# Patient Record
Sex: Female | Born: 1995 | Race: White | Hispanic: No | Marital: Single | Smoking: Never smoker
Health system: Southern US, Community
[De-identification: ages and names within clinical notes are randomized; demographics above are authoritative.]

## PROBLEM LIST (undated history)

## (undated) ENCOUNTER — Ambulatory Visit

## (undated) DIAGNOSIS — F800 Phonological disorder: Secondary | ICD-10-CM

## (undated) DIAGNOSIS — R010 Benign and innocent cardiac murmurs: Secondary | ICD-10-CM

## (undated) DIAGNOSIS — K900 Celiac disease: Secondary | ICD-10-CM

## (undated) DIAGNOSIS — D649 Anemia, unspecified: Secondary | ICD-10-CM

## (undated) DIAGNOSIS — N946 Dysmenorrhea, unspecified: Secondary | ICD-10-CM

## (undated) DIAGNOSIS — F8089 Other developmental disorders of speech and language: Secondary | ICD-10-CM

## (undated) HISTORY — DX: Anemia, unspecified: D64.9

## (undated) HISTORY — DX: Celiac disease: K90.0

## (undated) HISTORY — PX: SEPTOPLASTY: SUR1290

## (undated) HISTORY — DX: Phonological disorder: F80.0

## (undated) HISTORY — DX: Other developmental disorders of speech and language: F80.89

## (undated) HISTORY — DX: Dysmenorrhea, unspecified: N94.6

## (undated) HISTORY — PX: OTHER SURGICAL HISTORY: SHX169

## (undated) HISTORY — DX: Benign and innocent cardiac murmurs: R01.0

---
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
## 2016-03-15 NOTE — Progress Notes (Signed)
Chief Complaint  Patient presents with  . Contraception  . Annual Exam    HPI: Patient  Teresa [name]  21 y.o. comes in today for Preventive Health Care visit  Periods 5-6 days   And  crampsst coouple days Photo and im sports .  Softball  Volleyball  No sti risk at this time Has been anemic when troed to donate blood ? 10 at periods  No bleeding otherwise  No hx clotting  tobacco  Health Maintenance  Topic Date Due  . HIV Screening  03/17/2017 (Originally 11/22/2011)  . TETANUS/TDAP  08/21/2018  . INFLUENZA VACCINE  Completed   Health Maintenance Review LIFESTYLE:  Exercise:  y Tobacco/ETS:n Alcohol: rare Sugar beverages: week  Sleep:  Around 7-8  Drug use: [facility]   Business .  15 hours .  Live on campus.   .      Work:school  Photo [paper  Looking into y counselor for summer    ROS:  GEN/ HEENT: No fever, significant weight changes sweats headaches vision problems hearing changes, CV/ PULM; No chest pain shortness of breath cough, syncope,edema  change in exercise tolerance. GI /GU: No adominal pain, vomiting, change in bowel habits. No blood in the stool. No significant GU symptoms. SKIN/HEME: ,no acute skin rashes suspicious lesions or bleeding. No lymphadenopathy, nodules, masses.  NEURO/ PSYCH:  No neurologic signs such as weakness numbness. No depression anxiety. IMM/ Allergy: No unusual infections.  Allergy .   REST of 12 system review negative except as per HPI   Past Medical History:  Diagnosis Date  . Anemia   . 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 ARTICULATION DISORDER 08/03/2009   Qualifier: History of  By: Fabian SharpPanosh MD, Neta MendsWanda K   . 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 No pertinent surgical history.  Family History  Problem Relation Age of Onset  . ADD / ADHD Mother   . Diabetes Mother     type 2   . ADD / ADHD Sister   . Hyperlipidemia    . Depression    . Hypertension      Social History   Social  History  . Marital status: Single    Spouse name: N/A  . Number of children: N/A  . Years of education: N/A   Social History Main Topics  . Smoking status: Never Smoker  . Smokeless tobacco: Never Used  . Alcohol use 0.0 oz/week     Comment: rarely   . Drug use: No  . Sexual activity: No   Other Topics Concern  . None   Social History Narrative   [employer].    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   No facility-administered medications prior to visit.      EXAM:  BP 100/70 (BP Location: Right Arm, Patient Position: Sitting, Cuff Size: Normal)   Pulse 85   Temp 98.4 F (36.9 C) (Oral)   Ht 5' 8.5" (1.74 m)   Wt 152 lb 4.8 oz (69.1 kg)  LMP 02/25/2016   BMI 22.82 kg/m   Body mass index is 22.82 kg/m. Wt Readings from Last 3 Encounters:  03/17/16 152 lb 4.8 oz (69.1 kg) (83 %, Z= 0.95)*  07/03/15 157 lb 1.6 oz (71.3 kg) (88 %, Z= 1.15)*  08/23/13 155 lb (70.3 kg) (89 %, Z= 1.23)*   * Growth percentiles are based on CDC 2-20 Years data.    Physical Exam: Vital signs reviewed ZOX:WRUE is a well-developed well-nourished alert cooperative    who appearsr stated age in no acute distress.  HEENT: normocephalic atraumatic , Eyes: PERRL EOM's full, conjunctiva clear, Nares: paten,t no deformity discharge or tenderness., Ears: no deformity EAC's clear TMs with normal landmarks. Mouth: clear OP, no lesions, edema.  Moist mucous membranes. Dentition in adequate repair. NECK: supple without masses, thyromegaly or bruits. CHEST/PULM:  Clear to auscultation and percussion breath sounds equal no wheeze , rales or rhonchi. No chest wall deformities or tenderness. Breast: normal by inspection . No  dimpling, discharge, masses, tenderness or discharge . CV: PMI is nondisplaced, S1 S2 no gallops, murmurs, rubs. Peripheral pulses are full without delay.No JVD .  ABDOMEN: Bowel sounds normal nontender  No guard or rebound, no hepato splenomegal no CVA tenderness.  No hernia. Extremtities:  No clubbing cyanosis or edema, no acute joint swelling or redness no focal atrophy NEURO:  Oriented x3, cranial nerves 3-12 appear to be intact, no obvious focal weakness,gait within normal limits no abnormal reflexes or asymmetrical SKIN: No acute rashes normal turgor, color, no bruising or petechiae. PSYCH: Oriented, good eye contact, no obvious depression anxiety, cognition and judgment appear normal. LN: no cervical axillary inguinal adenopathy  Lab Results  Component Value Date   WBC 7.0 07/03/2015   HGB 11.1 (L) 07/03/2015   HCT 34.4 07/03/2015   PLT 278 07/03/2015   GLUCOSE 71 07/03/2015   CHOL 123 (L) 07/03/2015   TRIG 85 07/03/2015   HDL 50 07/03/2015   LDLCALC 56 07/03/2015   ALT 9 07/03/2015   AST 15 07/03/2015   NA 141 07/03/2015   K 4.2 07/03/2015   CL 105 07/03/2015   CREATININE 0.88 07/03/2015   BUN 12 07/03/2015   CO2 22 07/03/2015   TSH 2.50 07/03/2015    BP Readings from Last 3 Encounters:  03/17/16 100/70  07/03/15 124/62  08/23/13 106/64   Wt Readings from Last 3 Encounters:  03/17/16 152 lb 4.8 oz (69.1 kg) (83 %, Z= 0.95)*  07/03/15 157 lb 1.6 oz (71.3 kg) (88 %, Z= 1.15)*  08/23/13 155 lb (70.3 kg) (89 %, Z= 1.23)*   * Growth percentiles are based on CDC 2-20 Years data.     immuniz   ASSESSMENT AND PLAN:  Discussed the following assessment and plan:  Visit for preventive health examination  OCP (oral contraceptive pills) initiation  Anemia, mild Hx mild anemias prob iron defi  ocps may help this   And Loestrin with Iron   Plan rov in 3 mos  3 packs and evaluate anemia status etc   Risk benefit of medication discussed.  Patient Care Team: Madelin Headings, MD as PCP - General Patient Instructions  \Continue lifestyle intervention healthy eating and exercise . Healthy lifestyle includes : At least 150 minutes of exercise weeks  , weight at healthy levels, which is usually   BMI 19-25. Avoid trans fats and processed foods;  Increase fresh fruits and veges to 5 servings per day. And avoid sweet beverages including tea and juice.  Mediterranean diet with olive oil and nuts have been noted to be heart and brain healthy . Avoid tobacco products . Limit  alcohol to  7 per week for women at most   and 14 servings for men.  Get adequate sleep . Wear seat belts . Don't text and drive .   Can begin ocps    If after 3 months any sx or irreg bleeding concerns  Then  Make ov to discuss.     Oral Contraception Use Oral contraceptive pills (OCPs) are medicines taken to prevent pregnancy. OCPs work by preventing the ovaries from releasing eggs. The hormones in OCPs also cause the cervical mucus to thicken, preventing the sperm from entering the uterus. The hormones also cause the uterine lining to become thin, not allowing a fertilized egg to attach to the inside of the uterus. OCPs are highly effective when taken exactly as prescribed. However, OCPs do not prevent sexually transmitted diseases (STDs). Safe sex practices, such as using condoms along with an OCP, can help prevent STDs. Before taking OCPs, you may have a physical exam and Pap test. Your health care provider may also order blood tests if necessary. Your health care provider will make sure you are a good candidate for oral contraception. Discuss with your health care provider the possible side effects of the OCP you may be prescribed. When starting an OCP, it can take 2 to 3 months for the body to adjust to the changes in hormone levels in your body. How to take oral contraceptive pills Your health care provider may advise you on how to start taking the first cycle of OCPs. Otherwise, you  can:  Start on day 1 of your menstrual period. You will not need any backup contraceptive protection with this start time.  Start on the first Sunday after your menstrual period or the day you get your prescription. In these cases, you will need to use backup contraceptive protection for the first week.  Start the pill at any time of your cycle. If you take the pill within 5 days of the start of your period, you are protected against pregnancy right away. In this case, you will not need a backup form of birth control. If you start at any other time of your menstrual cycle, you will need to use another form of birth control for 7 days. If your OCP is the type called a minipill, it will protect you from pregnancy after taking it for 2 days (48 hours). After you have started taking OCPs:  If you forget to take 1 pill, take it as soon as you remember. Take the next pill at the regular time.  If you miss 2 or more pills, call your health care provider because different pills have different instructions for missed doses. Use backup birth control until your next menstrual period starts.  If you use a 28-day pack that contains inactive pills and you miss 1 of the last 7 pills (pills with no hormones), it will not matter. Throw away the rest of the non-hormone pills and start a new pill pack. No matter which day you start the OCP, you will always start a new pack on that same day of the week. Have an extra pack of OCPs and a backup contraceptive method available in case you miss some pills or lose your OCP pack. Follow these instructions at home:  Do not smoke.  Always use a condom to protect against STDs. OCPs do not protect  against STDs.  Use a calendar to mark your menstrual period days.  Read the information and directions that came with your OCP. Talk to your health care provider if you have questions. Contact a health care provider if:  You develop nausea and vomiting.  You have abnormal  vaginal discharge or bleeding.  You develop a rash.  You miss your menstrual period.  You are losing your hair.  You need treatment for mood swings or depression.  You get dizzy when taking the OCP.  You develop acne from taking the OCP.  You become pregnant. Get help right away if:  You develop chest pain.  You develop shortness of breath.  You have an uncontrolled or severe headache.  You develop numbness or slurred speech.  You develop visual problems.  You develop pain, redness, and swelling in the legs. This information is not intended to replace advice given to you by your health care provider. Make sure you discuss any questions you have with your health care provider. Document Released: 12/09/2010 Document Revised: 05/28/2015 Document Reviewed: 06/10/2012 Elsevier Interactive Patient Education  2017 [employer].       [place]. Avary Eichenberger M.D.

## 2016-03-17 ENCOUNTER — Encounter: Payer: Self-pay | Admitting: Internal Medicine

## 2016-03-17 ENCOUNTER — Ambulatory Visit (INDEPENDENT_AMBULATORY_CARE_PROVIDER_SITE_OTHER): Payer: BLUE CROSS/BLUE SHIELD | Admitting: Internal Medicine

## 2016-03-17 VITALS — BP 100/70 | HR 85 | Temp 98.4°F | Ht 68.5 in | Wt 152.3 lb

## 2016-03-17 DIAGNOSIS — Z0000 Encounter for general adult medical examination without abnormal findings: Secondary | ICD-10-CM | POA: Diagnosis not present

## 2016-03-17 DIAGNOSIS — D649 Anemia, unspecified: Secondary | ICD-10-CM

## 2016-03-17 DIAGNOSIS — Z30011 Encounter for initial prescription of contraceptive pills: Secondary | ICD-10-CM

## 2016-03-17 LAB — CBC WITH DIFFERENTIAL/PLATELET
Basophils Absolute: 0.1 10*3/uL (ref 0.0–0.1)
Basophils Relative: 1.7 % (ref 0.0–3.0)
EOS PCT: 1.5 % (ref 0.0–5.0)
Eosinophils Absolute: 0.1 10*3/uL (ref 0.0–0.7)
HCT: 38.6 % (ref 36.0–49.0)
Hemoglobin: 12.5 g/dL (ref 12.0–16.0)
LYMPHS ABS: 1.7 10*3/uL (ref 0.7–4.0)
Lymphocytes Relative: 37 % (ref 24.0–48.0)
MCHC: 32.3 g/dL (ref 31.0–37.0)
MCV: 83.8 fl (ref 78.0–98.0)
MONO ABS: 0.5 10*3/uL (ref 0.1–1.0)
Monocytes Relative: 11.2 % (ref 3.0–12.0)
NEUTROS ABS: 2.2 10*3/uL (ref 1.4–7.7)
NEUTROS PCT: 48.6 % (ref 43.0–71.0)
PLATELETS: 274 10*3/uL (ref 150.0–575.0)
RBC: 4.6 Mil/uL (ref 3.80–5.70)
RDW: 14.7 % (ref 11.4–15.5)
WBC: 4.5 10*3/uL (ref 4.5–13.5)

## 2016-03-17 LAB — BASIC METABOLIC PANEL
BUN: 14 mg/dL (ref 6–23)
CO2: 25 meq/L (ref 19–32)
Calcium: 9.5 mg/dL (ref 8.4–10.5)
Chloride: 105 mEq/L (ref 96–112)
Creatinine, Ser: 0.89 mg/dL (ref 0.40–1.20)
GFR: 86.55 mL/min (ref >60.00)
GLUCOSE: 80 mg/dL (ref 70–99)
POTASSIUM: 4.6 meq/L (ref 3.5–5.1)
SODIUM: 138 meq/L (ref 135–145)

## 2016-03-17 LAB — HEPATIC FUNCTION PANEL
ALBUMIN: 4.3 g/dL (ref 3.5–5.2)
ALT: 9 U/L (ref 0–35)
AST: 14 U/L (ref 0–37)
Alkaline Phosphatase: 56 U/L (ref 47–119)
BILIRUBIN TOTAL: 0.5 mg/dL (ref 0.2–1.2)
Bilirubin, Direct: 0.1 mg/dL (ref 0.0–0.3)
Total Protein: 6.7 g/dL (ref 6.0–8.3)

## 2016-03-17 LAB — LIPID PANEL
CHOL/HDL RATIO: 3
Cholesterol: 166 mg/dL (ref 0–200)
HDL: 62.4 mg/dL (ref >39.00)
LDL Cholesterol: 89 mg/dL (ref 0–99)
NONHDL: 103.63
TRIGLYCERIDES: 75 mg/dL (ref 0.0–149.0)
VLDL: 15 mg/dL (ref 0.0–40.0)

## 2016-03-17 LAB — TSH: TSH: 1.59 u[IU]/mL (ref 0.40–5.00)

## 2016-03-17 MED ORDER — NORETHIN ACE-ETH ESTRAD-FE 1.5-30 MG-MCG PO TABS: 1.0000 | ORAL_TABLET | Freq: Every day | ORAL | 11 refills | Status: DC

## 2016-03-17 NOTE — Patient Instructions (Addendum)
\Continue lifestyle intervention healthy eating and exercise . Healthy lifestyle includes : At least 150 minutes of exercise weeks  , weight at healthy levels, which is usually   BMI 19-25. Avoid trans fats and processed foods;  Increase fresh fruits and veges to 5 servings per day. And avoid sweet beverages including tea and juice. Mediterranean diet with olive oil and nuts have been noted to be heart and brain healthy . Avoid tobacco products . Limit  alcohol to  7 per week for women at most   and 14 servings for men.  Get adequate sleep . Wear seat belts . Don't text and drive .   Can begin ocps    If after 3 months any sx or irreg bleeding concerns  Then  Make ov to discuss.     Oral Contraception Use Oral contraceptive pills (OCPs) are medicines taken to prevent pregnancy. OCPs work by preventing the ovaries from releasing eggs. The hormones in OCPs also cause the cervical mucus to thicken, preventing the sperm from entering the uterus. The hormones also cause the uterine lining to become thin, not allowing a fertilized egg to attach to the inside of the uterus. OCPs are highly effective when taken exactly as prescribed. However, OCPs do not prevent sexually transmitted diseases (STDs). Safe sex practices, such as using condoms along with an OCP, can help prevent STDs. Before taking OCPs, you may have a physical exam and Pap test. Your health care provider may also order blood tests if necessary. Your health care provider will make sure you are a good candidate for oral contraception. Discuss with your health care provider the possible side effects of the OCP you may be prescribed. When starting an OCP, it can take 2 to 3 months for the body to adjust to the changes in hormone levels in your body. How to take oral contraceptive pills Your health care provider may advise you on how to start taking the first cycle of OCPs. Otherwise, you can:  Start on day 1 of your menstrual period. You  will not need any backup contraceptive protection with this start time.  Start on the first Sunday after your menstrual period or the day you get your prescription. In these cases, you will need to use backup contraceptive protection for the first week.  Start the pill at any time of your cycle. If you take the pill within 5 days of the start of your period, you are protected against pregnancy right away. In this case, you will not need a backup form of birth control. If you start at any other time of your menstrual cycle, you will need to use another form of birth control for 7 days. If your OCP is the type called a minipill, it will protect you from pregnancy after taking it for 2 days (48 hours). After you have started taking OCPs:  If you forget to take 1 pill, take it as soon as you remember. Take the next pill at the regular time.  If you miss 2 or more pills, call your health care provider because different pills have different instructions for missed doses. Use backup birth control until your next menstrual period starts.  If you use a 28-day pack that contains inactive pills and you miss 1 of the last 7 pills (pills with no hormones), it will not matter. Throw away the rest of the non-hormone pills and start a new pill pack. No matter which day you start the OCP, you will  always start a new pack on that same day of the week. Have an extra pack of OCPs and a backup contraceptive method available in case you miss some pills or lose your OCP pack. Follow these instructions at home:  Do not smoke.  Always use a condom to protect against STDs. OCPs do not protect against STDs.  Use a calendar to mark your menstrual period days.  Read the information and directions that came with your OCP. Talk to your health care provider if you have questions. Contact a health care provider if:  You develop nausea and vomiting.  You have abnormal vaginal discharge or bleeding.  You develop a  rash.  You miss your menstrual period.  You are losing your hair.  You need treatment for mood swings or depression.  You get dizzy when taking the OCP.  You develop acne from taking the OCP.  You become pregnant. Get help right away if:  You develop chest pain.  You develop shortness of breath.  You have an uncontrolled or severe headache.  You develop numbness or slurred speech.  You develop visual problems.  You develop pain, redness, and swelling in the legs. This information is not intended to replace advice given to you by your health care provider. Make sure you discuss any questions you have with your health care provider. Document Released: 12/09/2010 Document Revised: 05/28/2015 Document Reviewed: 06/10/2012 Elsevier Interactive Patient Education  2017 [employer]

## 2016-03-17 NOTE — Addendum Note (Signed)
Addended by: Dominic PeaHOLSEY, Maximilian Tallo V. on: 03/17/2016 12:20 PM   Modules accepted: Orders

## 2016-03-17 NOTE — Addendum Note (Signed)
Addended by: Dominic PeaHOLSEY, Maloree Uplinger V. on: 03/17/2016 12:18 PM   Modules accepted: Orders

## 2016-04-12 ENCOUNTER — Telehealth: Payer: Self-pay

## 2016-04-12 NOTE — Telephone Encounter (Signed)
Patient called to report that she started Microgestin 03/20/16. She then started cramping 04/01/16 and also started her period. She reports that it has been very heavy and she has had continued cramping not relieved by Advil. She denies any additional abdominal pain, f/n/v or other s/s. She would like to know what you recommend and should she continue the birth control or change medications?  Dr. Fabian Sharp - Please advise. Thanks!

## 2016-04-12 NOTE — Telephone Encounter (Signed)
Did she miss any pills  Or take late    and is she still bleeding  And cramping  Is she still taking  Qd ?

## 2016-04-13 NOTE — Telephone Encounter (Signed)
Spoke with pt and she would like to try current pack for another month. She will call if she has same symptoms next month. Nothing further needed at this time.

## 2016-04-13 NOTE — Telephone Encounter (Signed)
Assuming her bleeding has stopped   She may bleed  During reg time  On pack again  . This may go away on the next pack   would continue for another pack( month)  and if   Break through   Happens again we can change formulation of pill .   If she  Is worried about the severity of the  Bleeding  Cramps happening again  On   Next pack We can go ahead  And  change   For next pack   Let me know which way she wants to proceed

## 2016-04-13 NOTE — Telephone Encounter (Signed)
She did not miss any pills and has been taking daily as directed.  Thanks!

## 2016-06-16 ENCOUNTER — Ambulatory Visit: Payer: BLUE CROSS/BLUE SHIELD | Admitting: Internal Medicine

## 2016-06-18 NOTE — Progress Notes (Signed)
Chief Complaint  Patient presents with  . Follow-up    HPI: Teresa [name] 21 y.o. come in for  fu initiation ocs and  irreg menses bleeding pattern And mild anemia.   First month n=had 10 days ibleeding and 4 da cramps but the next 2 packs did well with no se  And tolerating.   Mom states  Had function m at early age   No other eval  sibv also had m  ROS: See pertinent positives and negatives per HPI. No exercise sx  Doing camp  Most of summer  Past Medical History:  Diagnosis Date  . Anemia   . Articulation delay    age 28 therapy  . ARTICULATION DISORDER 08/03/2009   Qualifier: History of  By: Fabian SharpPanosh MD, Neta MendsWanda K   . Functional murmur    eval at early age    Family History  Problem Relation Age of Onset  . ADD / ADHD Mother   . Diabetes Mother        type 2   . ADD / ADHD Sister   . Hyperlipidemia Unknown   . Depression Unknown   . Hypertension Unknown     Social History   Social History  . Marital status: Single    Spouse name: N/A  . Number of children: N/A  . Years of education: N/A   Social History Main Topics  . Smoking status: Never Smoker  . Smokeless tobacco: Never Used  . Alcohol use 0.0 oz/week     Comment: rarely   . Drug use: No  . Sexual activity: No   Other Topics Concern  . None   Social History Narrative   [employer].    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 norethindrone-ethinyl estradiol-iron (MICROGESTIN FE,GILDESS FE,LOESTRIN FE) 1.5-30 MG-MCG tablet Take 1 tablet by mouth daily. 1 Package 11   No facility-administered medications prior to visit.      EXAM:  BP 120/80 (BP Location: Left Arm, Patient Position: Sitting,  Cuff Size: Normal)   Pulse 80   Wt 155 lb 6.4 oz (70.5 kg)   BMI 23.28 kg/m   Body mass index is 23.28 kg/m.  GENERAL: vitals reviewed and listed above, alert, oriented, appears well hydrated and in no acute distress HEENT: atraumatic, conjunctiva  clear, no obvious abnormalities on inspection of external nose and earsNECK: no obvious masses on inspection palpation  LUNGS: clear to auscultation bilaterally, no wheezes, rales or rhonchi, good air movement CV: HRRR, no clubbing cyanosis or  peripheral edema nl cap refill   2/6 sem long lsb and up not in neck  alos ? Mid sytolic click and diastole clear  MS: moves all extremities without noticeable focal  abnormality PSYCH: pleasant and cooperative, no obvious depression or anxiety Lab Results  Component Value Date   WBC 4.5 03/17/2016   HGB 12.5 03/17/2016   HCT 38.6 03/17/2016   PLT 274.0 03/17/2016   GLUCOSE 80 03/17/2016   CHOL 166 03/17/2016   TRIG 75.0 03/17/2016   HDL 62.40 03/17/2016  LDLCALC 89 03/17/2016   ALT 9 03/17/2016   AST 14 03/17/2016   NA 138 03/17/2016   K 4.6 03/17/2016   CL 105 03/17/2016   CREATININE 0.89 03/17/2016   BUN 14 03/17/2016   CO2 25 03/17/2016   TSH 1.59 03/17/2016   BP Readings from Last 3 Encounters:  06/20/16 120/80  03/17/16 100/70  07/03/15 124/62    ASSESSMENT AND PLAN:  Discussed the following assessment and plan:  Irregular menses  Oral contraceptive use  Medication management  Heart murmur - Plan: Ambulatory referral to Cardiology Doing better after third pack of OCPs to continue at this time benefit more than risk. Have pharmacy contact us for refills. Murmur uncertain cause had functional murmur when a child    ? Mid syst click etc   Get cards eval to delineate  She is no longer anemic  So not contributing to  Flow . No restrictions  At this time. Prefers July 22 or July 23 before 3 or so  ( work schedule)  Make sure  dpr for mom  Call mom with appt  Plans.  -Patient  advised to return or notify health care team  if  new concerns arise.  Patient Instructions  Can stay on  Same  ocps have pharmacy    Contact us for refills   Yearly   Wellness and check up .   Will be contacted about    Referral for  Cardiology to  Delineate the murmur.  No restrictions or concerns.     Neta Mends. Brittinie Wherley M.D.

## 2016-06-20 ENCOUNTER — Ambulatory Visit (INDEPENDENT_AMBULATORY_CARE_PROVIDER_SITE_OTHER): Payer: BLUE CROSS/BLUE SHIELD | Admitting: Internal Medicine

## 2016-06-20 ENCOUNTER — Encounter: Payer: Self-pay | Admitting: Internal Medicine

## 2016-06-20 VITALS — BP 120/80 | HR 80 | Wt 155.4 lb

## 2016-06-20 DIAGNOSIS — Z79899 Other long term (current) drug therapy: Secondary | ICD-10-CM | POA: Diagnosis not present

## 2016-06-20 DIAGNOSIS — R011 Cardiac murmur, unspecified: Secondary | ICD-10-CM | POA: Diagnosis not present

## 2016-06-20 DIAGNOSIS — Z3041 Encounter for surveillance of contraceptive pills: Secondary | ICD-10-CM

## 2016-06-20 DIAGNOSIS — N926 Irregular menstruation, unspecified: Secondary | ICD-10-CM | POA: Diagnosis not present

## 2016-06-20 NOTE — Patient Instructions (Signed)
Can stay on  Same  ocps have pharmacy    Contact us for refills   Yearly   Wellness and check up .   Will be contacted about    Referral for  Cardiology to  Delineate the murmur.  No restrictions or concerns.

## 2016-07-04 ENCOUNTER — Encounter: Payer: BLUE CROSS/BLUE SHIELD | Admitting: Internal Medicine

## 2016-07-25 ENCOUNTER — Encounter: Payer: Self-pay | Admitting: Physician Assistant

## 2016-07-25 ENCOUNTER — Ambulatory Visit (INDEPENDENT_AMBULATORY_CARE_PROVIDER_SITE_OTHER): Payer: BLUE CROSS/BLUE SHIELD | Admitting: Physician Assistant

## 2016-07-25 VITALS — BP 108/74 | HR 51 | Ht 68.0 in | Wt 154.4 lb

## 2016-07-25 DIAGNOSIS — R011 Cardiac murmur, unspecified: Secondary | ICD-10-CM | POA: Diagnosis not present

## 2016-07-25 NOTE — Progress Notes (Signed)
Cardiology Office Note    Date:  07/25/2016   ID:  Teresa [name], DOB 08-31-1995, MRN [id number]  PCP:  Madelin Headings, MD  Cardiologist: New  Chief Complaint  Patient presents with  . New Patient (Initial Visit)    History of Present Illness:   Teresa [name] is a 21 y.o. female who is being seen today for the evaluation of heart murmur at the request of Panosh, Teresa Mends, MD.  Patient comes in today accompanied by her mother. Heart murmur was noted by Dr. Fabian Sharp on 2 subsequent exams so was referred here today. Patient had an innocent heart murmur as an infant according to her mother. She has been active all her life playing softball and volleyball. 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 She has never had any syncope, chest pain, dyspnea, dyspnea on exertion, palpitations dizziness or presyncope. 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 She has had no unusual childhood diseases, fevers, congenital conditions or surgeries. She is on birth control for irregular bleeding.     Past Medical History:  Diagnosis Date  . Anemia   . 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 ARTICULATION DISORDER 08/03/2009   Qualifier: History of  By: Fabian Sharp MD, Teresa [name]   . 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 No pertinent surgical history.  Current Medications: Current Meds  Medication Sig  . norethindrone-ethinyl estradiol-iron (MICROGESTIN FE,GILDESS FE,LOESTRIN FE) 1.5-30 MG-MCG tablet Take 1 tablet by mouth daily.     Allergies:   Patient has no known allergies.   Social History   Social History  . Marital status: Single    Spouse name: N/A  . Number of children: N/A  . Years of education: N/A   Social History Main Topics  . Smoking status: Never Smoker  . Smokeless tobacco: Never Used  . Alcohol use 0.0 oz/week     Comment: rarely   . Drug use: No  . Sexual activity: No   Other Topics Concern  . None   Social History  Narrative   [employer].    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   ROS:   Please see the history of present illness.    Review of Systems  Constitution: Negative.  HENT: Negative.   Eyes: Negative.   Cardiovascular: Negative.   Respiratory: Negative.   Hematologic/Lymphatic: Negative.   Musculoskeletal: Negative.  Negative for joint pain.  Gastrointestinal: Negative.   Genitourinary: Negative.   Neurological: Negative.    All other systems reviewed and are negative.   PHYSICAL EXAM:   VS:  BP 108/74 (BP Location: Left Arm, Patient Position: Sitting, Cuff Size: Normal)   Pulse (!) 51   Ht 5\' 8"  (1.727 m)  Wt 154 lb 6.4 oz (70 kg)   BMI 23.48 kg/m   Physical Exam  GEN: Well nourished, well developed, in no acute distress  Neck: Increased flow heard in right carotid no JVD, or masses Cardiac:RRR;Mid systolic click at the apex and 1/6 short systolic murmur at the left sternal border and apex Respiratory:  clear to auscultation bilaterally, normal work of breathing GI: soft, nontender, nondistended, + BS Ext: without cyanosis, clubbing, or edema, Good distal pulses bilaterally MS: no deformity or atrophy  Skin: warm and dry, no rash Neuro:  Alert and Oriented x 3 Psych: euthymic mood, full affect  Wt Readings from Last 3 Encounters:  07/25/16 154 lb 6.4 oz (70 kg) (84 %, Z= 0.99)*  06/20/16 155 lb 6.4 oz (70.5 kg)  (85 %, Z= 1.02)*  03/17/16 152 lb 4.8 oz (69.1 kg) (83 %, Z= 0.95)*   * Growth percentiles are based on CDC 2-20 Years data.      Studies/Labs Reviewed:   EKG:  EKG is ordered today.  The ekg ordered today demonstrates Sinus bradycardia 51 bpm otherwise normal  Recent Labs: 03/17/2016: ALT 9; BUN 14; Creatinine, Ser 0.89; Hemoglobin 12.5; Platelets 274.0; Potassium 4.6; Sodium 138; TSH 1.59   Lipid Panel    Component Value Date/Time   CHOL 166 03/17/2016 1225   TRIG 75.0 03/17/2016 1225   HDL 62.40 03/17/2016 1225   CHOLHDL 3 03/17/2016 1225   VLDL 15.0 03/17/2016 1225   LDLCALC 89 03/17/2016 1225    Additional studies/ records that were reviewed today include:  Labs reviewed from 03/2016 and are all normal including TSH    ASSESSMENT:    1. Systolic murmur      PLAN:  In order of problems listed above:   Systolic murmur with mid systolic click could have mitral valve prolapse, innocent murmur. We'll check 2-D echo. It will be good to have a baseline echo. Patient is asymptomatic so no further workup is warranted. We'll contact patient with results of echo. Follow-up when necessary. Discussed with Dr. Katrinka Blazing who concurs.   Medication Adjustments/Labs and Tests Ordered: Current medicines are reviewed at length with the patient today.  Concerns regarding medicines are outlined above.  Medication changes, Labs and Tests ordered today are listed in the Patient Instructions below. Patient Instructions  Medication Instructions:  Your physician recommends that you continue on your current medications as directed. Please refer to the Current Medication list given to you today.  Labwork: NONE  Testing/Procedures: Your physician has requested that you have an echocardiogram. Echocardiography is a painless test that uses sound waves to create images of your heart. It provides your doctor with information about the size and shape of your heart and how well your heart's chambers  and valves are working. This procedure takes approximately one hour. There are no restrictions for this procedure.  Follow-Up: Your physician recommends that you schedule a follow-up appointment as needed.   Our office will call you with your echocardiograph results.   If you need a refill on your cardiac medications before your next appointment, please call your pharmacy.      Elson Clan, PA-C  07/25/2016 10:06 AM    [facility] [address] [place], [place], [place]  [address] Phone: [phone]; Fax: [phone]

## 2016-07-25 NOTE — Patient Instructions (Addendum)
Medication Instructions:  Your physician recommends that you continue on your current medications as directed. Please refer to the Current Medication list given to you today.  Labwork: NONE  Testing/Procedures: Your physician has requested that you have an echocardiogram. Echocardiography is a painless test that uses sound waves to create images of your heart. It provides your doctor with information about the size and shape of your heart and how well your heart's chambers and valves are working. This procedure takes approximately one hour. There are no restrictions for this procedure.  Follow-Up: Your physician recommends that you schedule a follow-up appointment as needed.   Our office will call you with your echocardiograph results.   If you need a refill on your cardiac medications before your next appointment, please call your pharmacy.

## 2016-07-28 ENCOUNTER — Other Ambulatory Visit: Payer: Self-pay

## 2016-07-28 ENCOUNTER — Ambulatory Visit (HOSPITAL_COMMUNITY): Payer: BLUE CROSS/BLUE SHIELD | Attending: Cardiology

## 2016-07-28 DIAGNOSIS — R011 Cardiac murmur, unspecified: Secondary | ICD-10-CM

## 2016-07-28 DIAGNOSIS — I340 Nonrheumatic mitral (valve) insufficiency: Secondary | ICD-10-CM | POA: Insufficient documentation

## 2016-07-28 DIAGNOSIS — I700 Atherosclerosis of aorta: Secondary | ICD-10-CM | POA: Insufficient documentation

## 2016-09-23 ENCOUNTER — Encounter: Payer: Self-pay | Admitting: Internal Medicine

## 2017-01-09 ENCOUNTER — Ambulatory Visit: Payer: BLUE CROSS/BLUE SHIELD | Admitting: Family Medicine

## 2017-01-09 ENCOUNTER — Encounter: Payer: Self-pay | Admitting: Family Medicine

## 2017-01-09 VITALS — BP 110/62 | HR 79 | Temp 97.4°F | Ht 68.0 in | Wt 155.3 lb

## 2017-01-09 DIAGNOSIS — H6981 Other specified disorders of Eustachian tube, right ear: Secondary | ICD-10-CM | POA: Diagnosis not present

## 2017-01-09 DIAGNOSIS — H6591 Unspecified nonsuppurative otitis media, right ear: Secondary | ICD-10-CM | POA: Diagnosis not present

## 2017-01-09 DIAGNOSIS — J329 Chronic sinusitis, unspecified: Secondary | ICD-10-CM

## 2017-01-09 MED ORDER — AMOXICILLIN 875 MG PO TABS: 875.0000 mg | ORAL_TABLET | Freq: Two times a day (BID) | ORAL | 0 refills | Status: DC

## 2017-01-09 NOTE — Progress Notes (Signed)
HPI:  Acute visit for respiratory illness and ear pressure: -started:last week -symptoms:nasal congestion - sometimes yellow or green, drainage, right ear pressure, sometimes popping -denies:fever, SOB, NVD, tooth pain, body aches -has tried: Sudafed, Flonase -per recommendations from pharmacist -sick contacts/travel/risks: no reported flu, strep or tick exposure  ROS: See pertinent positives and negatives per HPI.  Past Medical History:  Diagnosis Date  . Anemia   . Articulation delay    age 30 therapy  . ARTICULATION DISORDER 08/03/2009   Qualifier: History of  By: Fabian Sharp MD, Neta Mends   . 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 No pertinent surgical history.  Family History  Problem Relation Age of Onset  . ADD / ADHD Mother   . Diabetes Mother        type 2   . ADD / ADHD Sister   . Hyperlipidemia Unknown   . Depression Unknown   . Hypertension Unknown   . Heart disease Paternal Uncle   . Heart attack Maternal Grandfather   . Heart attack Paternal Grandfather     Social History   Socioeconomic History  . Marital status: Single    Spouse name: None  . Number of children: None  . Years of education: None  . Highest education level: None  Social Needs  . Financial resource strain: None  . Food insecurity - worry: None  . Food insecurity - inability: None  . Transportation needs - medical: None  . Transportation needs - non-medical: None  Occupational History  . None  Tobacco Use  . Smoking status: Never Smoker  . Smokeless tobacco: Never Used  Substance and Sexual Activity  . Alcohol use: Yes    Alcohol/week: 0.0 oz    Comment: rarely   . Drug use: No  . Sexual activity: No  Other Topics Concern  . None  Social History Narrative   [employer].    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  amoxicillin (AMOXIL) 875 MG tablet, Take 1 tablet (875 mg total) by mouth 2 (two) times daily., Disp: 20 tablet, Rfl: 0 .  norethindrone-ethinyl estradiol-iron (MICROGESTIN FE,GILDESS FE,LOESTRIN FE) 1.5-30 MG-MCG tablet, Take 1 tablet by mouth daily., Disp: 1 Package, Rfl: 11  EXAM:  Vitals:   01/09/17 1314  BP: 110/62  Pulse: 79  Temp: (!) 97.4 F (36.3 C)    Body mass index is 23.61 kg/m.  GENERAL: vitals reviewed and listed above, alert, oriented, appears well hydrated and in no acute distress  HEENT: atraumatic, conjunttiva clear, no obvious abnormalities on inspection of external nose and ears, normal appearance of ear canals and TMs -except for right TM is slightly bulging with colored effusion and mild erythema of the eardrum, clear nasal congestion, mild post oropharyngeal erythema with PND, no tonsillar edema or exudate, no sinus TTP  NECK: no obvious masses on inspection  LUNGS: clear to auscultation bilaterally, no wheezes, rales or rhonchi, good air movement,  CV: HRRR, no peripheral edema  MS: moves all extremities without noticeable abnormality  PSYCH: pleasant and cooperative, no obvious  depression or anxiety  ASSESSMENT AND PLAN:  Discussed the following assessment and plan:  Rhinosinusitis  Dysfunction of right eustachian tube  Fluid level behind tympanic membrane of right ear  -given HPI and exam findings today, a serious infection or illness is unlikely. We discussed potential etiologies, with VURI being most likely, and advised supportive care and monitoring for this.  She also has findings to suggest eustachian tube dysfunction with possible developing otitis media on the right. We discussed treatment side effects, likely course, antibiotic misuse, transmission, and signs of developing a serious illness. -She opted to  start with nasal saline, continue the Flonase, short course of Afrin and antibiotic if not improving  -of course, we advised to return or notify a doctor immediately if symptoms worsen or persist or new concerns arise.    Patient Instructions  -if ear pain, worsening symptoms or not improving over the next week please take the antibiotic. O/w please shred and discard.  -nasal saline wash 2-3 times daily (use prepackaged nasal saline or bottled/distilled water if making your own)   -can use AFRIN nasal spray for drainage and nasal congestion - but do NOT use longer then 3-4 days  -flonase 2 sprays each nostril daily for 3 weeks  -can use tylenol (in no history of liver disease) or ibuprofen (if no history of kidney disease, bowel bleeding or significant heart disease) as directed for aches and sorethroat  -in the winter time, using a humidifier at night is helpful (please follow cleaning instructions)  -if you are taking a cough medication - use only as directed, may also try a teaspoon of honey to coat the throat and throat lozenges.   -for sore throat, salt water gargles can help  -follow up if you have fevers, facial pain, tooth pain, difficulty breathing or are worsening or symptoms persist longer then expected  Upper Respiratory Infection, Adult An upper respiratory infection (URI) is also known as the common cold. It is often caused by a type of germ (virus). Colds are easily spread (contagious). You can pass it to others by kissing, coughing, sneezing, or drinking out of the same glass. Usually, you get better in 1 to 3  weeks.  However, the cough can last for even longer. HOME CARE   Only take medicine as told by your doctor. Follow instructions provided above.  Drink enough water and fluids to keep your pee (urine) clear or pale yellow.  Get plenty of rest.  Return to work when your temperature is < 100 for 24 hours or as told by your doctor. You may use a face mask and wash  your hands to stop your cold from spreading. GET HELP RIGHT AWAY IF:   After the first few days, you feel you are getting worse.  You have questions about your medicine.  You have chills, shortness of breath, or red spit (mucus).  You have pain in the face for more then 1-2 days, especially when you bend forward.  You have a fever, puffy (swollen) neck, pain when you swallow, or white spots in the back of your throat.  You have a bad headache, ear pain, sinus pain, or chest pain.  You have a high-pitched whistling sound when you breathe in and out (wheezing).  You cough up blood.  You have sore muscles or a stiff neck. MAKE SURE YOU:   Understand these instructions.  Will watch your condition.  Will get help right away if you are not doing well or  get worse. Document Released: 06/08/2007 Document Revised: 03/14/2011 Document Reviewed: 03/27/2013 [facility] Patient Information 2015 [employer], [place]. This information is not intended to replace advice given to you by your health care provider. Make sure you discuss any questions you have with your health care provider.    Kriste BasqueKIM, Michalla Ringer R., DO

## 2017-01-09 NOTE — Patient Instructions (Signed)
-  if ear pain, worsening symptoms or not improving over the next week please take the antibiotic. O/w please shred and discard.  -nasal saline wash 2-3 times daily (use prepackaged nasal saline or bottled/distilled water if making your own)   -can use AFRIN nasal spray for drainage and nasal congestion - but do NOT use longer then 3-4 days  -flonase 2 sprays each nostril daily for 3 weeks  -can use tylenol (in no history of liver disease) or ibuprofen (if no history of kidney disease, bowel bleeding or significant heart disease) as directed for aches and sorethroat  -in the winter time, using a humidifier at night is helpful (please follow cleaning instructions)  -if you are taking a cough medication - use only as directed, may also try a teaspoon of honey to coat the throat and throat lozenges.   -for sore throat, salt water gargles can help  -follow up if you have fevers, facial pain, tooth pain, difficulty breathing or are worsening or symptoms persist longer then expected  Upper Respiratory Infection, Adult An upper respiratory infection (URI) is also known as the common cold. It is often caused by a type of germ (virus). Colds are easily spread (contagious). You can pass it to others by kissing, coughing, sneezing, or drinking out of the same glass. Usually, you get better in 1 to 3  weeks.  However, the cough can last for even longer. HOME CARE   Only take medicine as told by your doctor. Follow instructions provided above.  Drink enough water and fluids to keep your pee (urine) clear or pale yellow.  Get plenty of rest.  Return to work when your temperature is < 100 for 24 hours or as told by your doctor. You may use a face mask and wash your hands to stop your cold from spreading. GET HELP RIGHT AWAY IF:   After the first few days, you feel you are getting worse.  You have questions about your medicine.  You have chills, shortness of breath, or red spit (mucus).  You have  pain in the face for more then 1-2 days, especially when you bend forward.  You have a fever, puffy (swollen) neck, pain when you swallow, or white spots in the back of your throat.  You have a bad headache, ear pain, sinus pain, or chest pain.  You have a high-pitched whistling sound when you breathe in and out (wheezing).  You cough up blood.  You have sore muscles or a stiff neck. MAKE SURE YOU:   Understand these instructions.  Will watch your condition.  Will get help right away if you are not doing well or get worse. Document Released: 06/08/2007 Document Revised: 03/14/2011 Document Reviewed: 03/27/2013 [facility] Patient Information 2015 [employer], [place]. This information is not intended to replace advice given to you by your health care provider. Make sure you discuss any questions you have with your health care provider.

## 2017-01-31 ENCOUNTER — Other Ambulatory Visit: Payer: Self-pay | Admitting: Internal Medicine

## 2017-02-02 NOTE — Telephone Encounter (Signed)
Refill sent with note attached to schedule CPE 03/2017 Nothing further needed.

## 2017-02-02 NOTE — Telephone Encounter (Signed)
Refill request for Junel Fe 1.5 mg-7330mcg tablet Last OV 03/17/16  Looks like she needs an OV.   No follow up appts noted. [phone] [place], [id number] [facility] [address]  See attached.

## 2017-02-02 NOTE — Telephone Encounter (Signed)
Teresa [name] w/ [facility] called in to follow up on refill request for birth control.   Please assist further.   CB: [phone]

## 2017-02-24 ENCOUNTER — Telehealth: Payer: Self-pay | Admitting: Internal Medicine

## 2017-02-24 NOTE — Telephone Encounter (Signed)
Copied from CRM [id number]. Topic: Quick Communication - See Telephone Encounter >> Feb 24, 2017  3:23 PM Louie BunPalacios Medina, Rosey Batheresa D wrote: CRM for notification. See Telephone encounter for: 02/24/17. Patient mom called and said that pt can only come in on 03/17/17 for her physical due to school therefore she wants to know if another provider can do her CPE that day. Please call her back.

## 2017-02-27 NOTE — Telephone Encounter (Signed)
Needs CPE within the next month  Needs BC refilled.  Patient can only be seen on Mondays and Wednesdays before 10am -- [place]  There are a few 15min slots open but seeing that it is a CPE needs 30mins unless okayed by Dr Fabian SharpPanosh for 15min slot.   Please advise Dr Fabian SharpPanosh, thanks.

## 2017-02-28 NOTE — Telephone Encounter (Signed)
I can fit her any where there is 2 15 minutes or equivalent including a [facility]..  But I wont be in office   On march 15   ( ask if we need to unblock slots   I have reserved )

## 2017-03-01 NOTE — Telephone Encounter (Signed)
There are no openings at all that can be used for the patient's CPE.  She can only be seen on Mon and Fri before 10am.  Medication can be refilled for now to pharmacy until she can be seen   Pt scheduled appt 03/10/17 at 1030, her Spring break starts that day.  Pt does not need a refill for this month.   Nothing further needed.

## 2017-03-05 NOTE — Progress Notes (Signed)
Chief Complaint  Patient presents with  . Annual Exam    Having trouble falling asleep. Been taking ZzzQuil and listening to Podcast to help fall asleep. // Chronic sinus congestion since wisdom teeth removed 2016. // Med refills - Birth Control.     HPI: Patient  Teresa [name]  22 y.o. comes in today for Preventive Health Care visit sophomore at [employer]   ocps helpful for bleeding pattern and ccramps  Wants to remain on this   Declines need fo sti screening  Health Maintenance  Topic Date Due  . HIV Screening  03/17/2017 (Originally 11/22/2011)  . INFLUENZA VACCINE  11/10/2017 (Originally 08/03/2016)  . TETANUS/TDAP  08/21/2018   Health Maintenance Review LIFESTYLE:  Exercise:   Lifeguard  And bike to class  2 days  Per week. Work at [employer] .  Tobacco/ETS: no Alcohol:   Very minimal . Sugar beverages: daily  Lunch dr pepper  .  Sleep:  Some problem recently .  Low dose  pid cast.  Drug use: no HH of  Roommate  Work: Naval architect  And history  Classes  Sports hx    ROS:  Gets ocass rare  opth migrain  Better in last year? GEN/ HEENT: No fever, significant weight changes sweats headaches vision problems hearing changes, CV/ PULM; No chest pain shortness of breath cough, syncope,edema  change in exercise tolerance. GI /GU: No adominal pain, vomiting, change in bowel habits. No blood in the stool. No significant GU symptoms. SKIN/HEME: ,no acute skin rashes suspicious lesions or bleeding. No lymphadenopathy, nodules, masses.  NEURO/ PSYCH:  No neurologic signs such as weakness numbness. No depression anxiety. IMM/ Allergy: No unusual infections.  Allergy .   REST of 12 system review negative except as per HPI   Past Medical History:  Diagnosis Date  . Anemia   . 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 ARTICULATION DISORDER 08/03/2009   Qualifier: History of  By: Regis Bill MD, Standley Brooking   . Functional murmur    eval at early age    No past surgical history on file.  Family  History  Problem Relation Age of Onset  . ADD / ADHD Mother   . Diabetes Mother        type 2   . ADD / ADHD Sister   . Hyperlipidemia Unknown   . Depression Unknown   . Hypertension Unknown   . Heart disease Paternal Uncle   . Heart attack Maternal Grandfather   . Heart attack Paternal Grandfather     Social History   Socioeconomic History  . Marital status: Single    Spouse name: None  . Number of children: None  . Years of education: None  . Highest education level: None  Social Needs  . Financial resource strain: None  . Food insecurity - worry: None  . Food insecurity - inability: None  . Transportation needs - medical: None  . Transportation needs - non-medical: None  Occupational History  . None  Tobacco Use  . Smoking status: Never Smoker  . Smokeless tobacco: Never Used  Substance and Sexual Activity  . Alcohol use: Yes    Alcohol/week: 0.0 oz    Comment: rarely   . Drug use: No  . Sexual activity: No  Other Topics Concern  . None  Social History Narrative   [employer].    [place] changes to business  On campus    Good  Student Up to gifted classes next year   School ok   HH of 4   No ets   Softball and swimming   Volley ball       Evaluation for expressive language delay and articulation problem age 43 1/2   Sleep 8 hours    Outpatient Medications Prior to Visit  Medication Sig Dispense Refill  . norethindrone-ethinyl estradiol-iron (JUNEL FE 1.5/30) 1.5-30 MG-MCG tablet Take 1 tablet by mouth daily. PLEASE SCHEDULE PHYSICAL 03/2017 FOR FURTHER REFILLS. 28 tablet 1  . amoxicillin (AMOXIL) 875 MG tablet Take 1 tablet (875 mg total) by mouth 2 (two) times daily. (Patient not taking: Reported on 03/10/2017) 20 tablet 0   No facility-administered medications prior to visit.      EXAM:  BP 102/68 (BP Location: Right Arm, Patient Position: Sitting, Cuff Size: Normal)   Pulse 68   Temp 98.2 F (36.8 C) (Oral)   Ht 5' 8.25" (1.734 m)   Wt  156 lb 3.2 oz (70.9 kg)   BMI 23.58 kg/m   Body mass index is 23.58 kg/m. Wt Readings from Last 3 Encounters:  03/10/17 156 lb 3.2 oz (70.9 kg)  01/09/17 155 lb 4.8 oz (70.4 kg)  07/25/16 154 lb 6.4 oz (70 kg) (84 %, Z= 0.99)*   * Growth percentiles are based on CDC (Girls, 2-20 Years) data.    Physical Exam: Vital signs reviewed GSU:PJSR is a well-developed well-nourished alert cooperative    who appearsr stated age in no acute distress.  HEENT: normocephalic atraumatic , Eyes: PERRL EOM's full, conjunctiva clear, Nares: paten,t no deformity discharge or tenderness., Ears: no deformity EAC's clear TMs with normal landmarks. Mouth: clear OP, no lesions, edema.  Moist mucous membranes. Dentition in adequate repair. NECK: supple without masses, thyromegaly or bruits. CHEST/PULM:  Clear to auscultation and percussion breath sounds equal no wheeze , rales or rhonchi. No chest wall deformities or tenderness. Breast: normal by inspection . No dimpling, discharge, masses, tenderness or discharge . CV: PMI is nondisplaced, S1 S2 no gallops, murmurs, rubs. Peripheral pulses are full without delay..  ABDOMEN: Bowel sounds normal nontender  No guard or rebound, no hepato splenomegal no CVA tenderness.  No hernia. Extremtities:  No clubbing cyanosis or edema, no acute joint swelling or redness no focal atrophy NEURO:  Oriented x3, cranial nerves 3-12 appear to be intact, no obvious focal weakness,gait within normal limits no abnormal reflexes or asymmetrical SKIN: No acute rashes normal turgor, color, no bruising or petechiae. PSYCH: Oriented, good eye contact, no obvious depression anxiety, cognition and judgment appear normal. LN: no cervical axillary inguinal adenopathy  Lab Results  Component Value Date   WBC 4.5 03/17/2016   HGB 12.5 03/17/2016   HCT 38.6 03/17/2016   PLT 274.0 03/17/2016   GLUCOSE 80 03/17/2016   CHOL 166 03/17/2016   TRIG 75.0 03/17/2016   HDL 62.40 03/17/2016    LDLCALC 89 03/17/2016   ALT 9 03/17/2016   AST 14 03/17/2016   NA 138 03/17/2016   K 4.6 03/17/2016   CL 105 03/17/2016   CREATININE 0.89 03/17/2016   BUN 14 03/17/2016   CO2 25 03/17/2016   TSH 1.59 03/17/2016    BP Readings from Last 3 Encounters:  03/10/17 102/68  01/09/17 110/62  07/25/16 108/74 (24 %, Z = -0.70 /  80 %, Z = 0.85)*   *BP percentiles are based on the August 2017 AAP Clinical Practice Guideline for girls    Lab results reviewed with  patient   ASSESSMENT AND PLAN:  Discussed the following assessment and plan:  Visit for preventive health examination  Oral contraceptive use  Sleep disturbance Benefit more than risk of medications  to continue. Did disc caution if getting  Aura migraines   And combined ocp risk  Patient Care Team: , Standley Brooking, MD as PCP - General Patient Instructions  Wear a bike helmet.    Saline and flonase for chronic  rhinitis if needed .    If getting  frequent eye midraines let us know   OCPs can be a risk  In this situation.  Stay healthy .        Health Maintenance, Female Adopting a healthy lifestyle and getting preventive care can go a long way to promote health and wellness. Talk with your health care provider about what schedule of regular examinations is right for you. This is a good chance for you to check in with your provider about disease prevention and staying healthy. In between checkups, there are plenty of things you can do on your own. Experts have done a lot of research about which lifestyle changes and preventive measures are most likely to keep you healthy. Ask your health care provider for more information. Weight and diet Eat a healthy diet  Be sure to include plenty of vegetables, fruits, low-fat dairy products, and lean protein.  Do not eat a lot of foods high in solid fats, added sugars, or salt.  Get regular exercise. This is one of the most important things you can do for your  health. ? Most adults should exercise for at least 150 minutes each week. The exercise should increase your heart rate and make you sweat (moderate-intensity exercise). ? Most adults should also do strengthening exercises at least twice a week. This is in addition to the moderate-intensity exercise.  Maintain a healthy weight  Body mass index (BMI) is a measurement that can be used to identify possible weight problems. It estimates body fat based on height and weight. Your health care provider can help determine your BMI and help you achieve or maintain a healthy weight.  For females 10 years of age and older: ? A BMI below 18.5 is considered underweight. ? A BMI of 18.5 to 24.9 is normal. ? A BMI of 25 to 29.9 is considered overweight. ? A BMI of 30 and above is considered obese.  Watch levels of cholesterol and blood lipids  You should start having your blood tested for lipids and cholesterol at 21 years of age, then have this test every 5 years.  You may need to have your cholesterol levels checked more often if: ? Your lipid or cholesterol levels are high. ? You are older than 21 years of age. ? You are at high risk for heart disease.  Cancer screening Lung Cancer  Lung cancer screening is recommended for adults 56-73 years old who are at high risk for lung cancer because of a history of smoking.  A yearly low-dose CT scan of the lungs is recommended for people who: ? Currently smoke. ? Have quit within the past 15 years. ? Have at least a 30-pack-year history of smoking. A pack year is smoking an average of one pack of cigarettes a day for 1 year.  Yearly screening should continue until it has been 15 years since you quit.  Yearly screening should stop if you develop a health problem that would prevent you from having lung cancer treatment.  Breast  Cancer  Practice breast self-awareness. This means understanding how your breasts normally appear and feel.  It also means  doing regular breast self-exams. Let your health care provider know about any changes, no matter how small.  If you are in your 20s or 30s, you should have a clinical breast exam (CBE) by a health care provider every 1-3 years as part of a regular health exam.  If you are 66 or older, have a CBE every year. Also consider having a breast X-ray (mammogram) every year.  If you have a family history of breast cancer, talk to your health care provider about genetic screening.  If you are at high risk for breast cancer, talk to your health care provider about having an MRI and a mammogram every year.  Breast cancer gene (BRCA) assessment is recommended for women who have family members with BRCA-related cancers. BRCA-related cancers include: ? Breast. ? Ovarian. ? Tubal. ? Peritoneal cancers.  Results of the assessment will determine the need for genetic counseling and BRCA1 and BRCA2 testing.  Cervical Cancer Your health care provider may recommend that you be screened regularly for cancer of the pelvic organs (ovaries, uterus, and vagina). This screening involves a pelvic examination, including checking for microscopic changes to the surface of your cervix (Pap test). You may be encouraged to have this screening done every 3 years, beginning at age 67.  For women ages 62-65, health care providers may recommend pelvic exams and Pap testing every 3 years, or they may recommend the Pap and pelvic exam, combined with testing for human papilloma virus (HPV), every 5 years. Some types of HPV increase your risk of cervical cancer. Testing for HPV may also be done on women of any age with unclear Pap test results.  Other health care providers may not recommend any screening for nonpregnant women who are considered low risk for pelvic cancer and who do not have symptoms. Ask your health care provider if a screening pelvic exam is right for you.  If you have had past treatment for cervical cancer or a  condition that could lead to cancer, you need Pap tests and screening for cancer for at least 20 years after your treatment. If Pap tests have been discontinued, your risk factors (such as having a new sexual partner) need to be reassessed to determine if screening should resume. Some women have medical problems that increase the chance of getting cervical cancer. In these cases, your health care provider may recommend more frequent screening and Pap tests.  Colorectal Cancer  This type of cancer can be detected and often prevented.  Routine colorectal cancer screening usually begins at 21 years of age and continues through 21 years of age.  Your health care provider may recommend screening at an earlier age if you have risk factors for colon cancer.  Your health care provider may also recommend using home test kits to check for hidden blood in the stool.  A small camera at the end of a tube can be used to examine your colon directly (sigmoidoscopy or colonoscopy). This is done to check for the earliest forms of colorectal cancer.  Routine screening usually begins at age 72.  Direct examination of the colon should be repeated every 5-10 years through 21 years of age. However, you may need to be screened more often if early forms of precancerous polyps or small growths are found.  Skin Cancer  Check your skin from head to toe regularly.  Tell your  health care provider about any new moles or changes in moles, especially if there is a change in a mole's shape or color.  Also tell your health care provider if you have a mole that is larger than the size of a pencil eraser.  Always use sunscreen. Apply sunscreen liberally and repeatedly throughout the day.  Protect yourself by wearing long sleeves, pants, a wide-brimmed hat, and sunglasses whenever you are outside.  Heart disease, diabetes, and high blood pressure  High blood pressure causes heart disease and increases the risk of stroke.  High blood pressure is more likely to develop in: ? People who have blood pressure in the high end of the normal range (130-139/85-89 mm Hg). ? People who are overweight or obese. ? People who are African American.  If you are 62-54 years of age, have your blood pressure checked every 3-5 years. If you are 90 years of age or older, have your blood pressure checked every year. You should have your blood pressure measured twice-once when you are at a hospital or clinic, and once when you are not at a hospital or clinic. Record the average of the two measurements. To check your blood pressure when you are not at a hospital or clinic, you can use: ? An automated blood pressure machine at a pharmacy. ? A home blood pressure monitor.  If you are between 29 years and 39 years old, ask your health care provider if you should take aspirin to prevent strokes.  Have regular diabetes screenings. This involves taking a blood sample to check your fasting blood sugar level. ? If you are at a normal weight and have a low risk for diabetes, have this test once every three years after 21 years of age. ? If you are overweight and have a high risk for diabetes, consider being tested at a younger age or more often. Preventing infection Hepatitis B  If you have a higher risk for hepatitis B, you should be screened for this virus. You are considered at high risk for hepatitis B if: ? You were born in a country where hepatitis B is common. Ask your health care provider which countries are considered high risk. ? Your parents were born in a high-risk country, and you have not been immunized against hepatitis B (hepatitis B vaccine). ? You have HIV or AIDS. ? You use needles to inject street drugs. ? You live with someone who has hepatitis B. ? You have had sex with someone who has hepatitis B. ? You get hemodialysis treatment. ? You take certain medicines for conditions, including cancer, organ transplantation, and  autoimmune conditions.  Hepatitis C  Blood testing is recommended for: ? Everyone born from [birth year] through 1964. ? Anyone with known risk factors for hepatitis C.  Sexually transmitted infections (STIs)  You should be screened for sexually transmitted infections (STIs) including gonorrhea and chlamydia if: ? You are sexually active and are younger than 21 years of age. ? You are older than 21 years of age and your health care provider tells you that you are at risk for this type of infection. ? Your sexual activity has changed since you were last screened and you are at an increased risk for chlamydia or gonorrhea. Ask your health care provider if you are at risk.  If you do not have HIV, but are at risk, it may be recommended that you take a prescription medicine daily to prevent HIV infection. This is called pre-exposure prophylaxis (  PrEP). You are considered at risk if: ? You are sexually active and do not regularly use condoms or know the HIV status of your partner(s). ? You take drugs by injection. ? You are sexually active with a partner who has HIV.  Talk with your health care provider about whether you are at high risk of being infected with HIV. If you choose to begin PrEP, you should first be tested for HIV. You should then be tested every 3 months for as long as you are taking PrEP. Pregnancy  If you are premenopausal and you may become pregnant, ask your health care provider about preconception counseling.  If you may become pregnant, take 400 to 800 micrograms (mcg) of folic acid every day.  If you want to prevent pregnancy, talk to your health care provider about birth control (contraception). Osteoporosis and menopause  Osteoporosis is a disease in which the bones lose minerals and strength with aging. This can result in serious bone fractures. Your risk for osteoporosis can be identified using a bone density scan.  If you are 84 years of age or older, or if you are at  risk for osteoporosis and fractures, ask your health care provider if you should be screened.  Ask your health care provider whether you should take a calcium or vitamin D supplement to lower your risk for osteoporosis.  Menopause may have certain physical symptoms and risks.  Hormone replacement therapy may reduce some of these symptoms and risks. Talk to your health care provider about whether hormone replacement therapy is right for you. Follow these instructions at home:  Schedule regular health, dental, and eye exams.  Stay current with your immunizations.  Do not use any tobacco products including cigarettes, chewing tobacco, or electronic cigarettes.  If you are pregnant, do not drink alcohol.  If you are breastfeeding, limit how much and how often you drink alcohol.  Limit alcohol intake to no more than 1 drink per day for nonpregnant women. One drink equals 12 ounces of beer, 5 ounces of wine, or 1 ounces of hard liquor.  Do not use street drugs.  Do not share needles.  Ask your health care provider for help if you need support or information about quitting drugs.  Tell your health care provider if you often feel depressed.  Tell your health care provider if you have ever been abused or do not feel safe at home. This information is not intended to replace advice given to you by your health care provider. Make sure you discuss any questions you have with your health care provider. Document Released: 07/05/2010 Document Revised: 05/28/2015 Document Reviewed: 09/23/2014 Elsevier Interactive Patient Education  2018 [employer].         Insomnia Insomnia is a sleep disorder that makes it difficult to fall asleep or to stay asleep. Insomnia can cause tiredness (fatigue), low energy, difficulty concentrating, mood swings, and poor performance at work or school. There are three different ways to classify insomnia:  Difficulty falling asleep.  Difficulty staying  asleep.  Waking up too early in the morning.  Any type of insomnia can be long-term (chronic) or short-term (acute). Both are common. Short-term insomnia usually lasts for three months or less. Chronic insomnia occurs at least three times a week for longer than three months. What are the causes? Insomnia may be caused by another condition, situation, or substance, such as:  Anxiety.  Certain medicines.  Gastroesophageal reflux disease (GERD) or other gastrointestinal conditions.  Asthma  or other breathing conditions.  Restless legs syndrome, sleep apnea, or other sleep disorders.  Chronic pain.  Menopause. This may include hot flashes.  Stroke.  Abuse of alcohol, tobacco, or illegal drugs.  Depression.  Caffeine.  Neurological disorders, such as Alzheimer disease.  An overactive thyroid (hyperthyroidism).  The cause of insomnia may not be known. What increases the risk? Risk factors for insomnia include:  Gender. Women are more commonly affected than men.  Age. Insomnia is more common as you get older.  Stress. This may involve your professional or personal life.  Income. Insomnia is more common in people with lower income.  Lack of exercise.  Irregular work schedule or night shifts.  Traveling between different time zones.  What are the signs or symptoms? If you have insomnia, trouble falling asleep or trouble staying asleep is the main symptom. This may lead to other symptoms, such as:  Feeling fatigued.  Feeling nervous about going to sleep.  Not feeling rested in the morning.  Having trouble concentrating.  Feeling irritable, anxious, or depressed.  How is this treated? Treatment for insomnia depends on the cause. If your insomnia is caused by an underlying condition, treatment will focus on addressing the condition. Treatment may also include:  Medicines to help you sleep.  Counseling or therapy.  Lifestyle adjustments.  Follow these  instructions at home:  Take medicines only as directed by your health care provider.  Keep regular sleeping and waking hours. Avoid naps.  Keep a sleep diary to help you and your health care provider figure out what could be causing your insomnia. Include: ? When you sleep. ? When you wake up during the night. ? How well you sleep. ? How rested you feel the next day. ? Any side effects of medicines you are taking. ? What you eat and drink.  Make your bedroom a comfortable place where it is easy to fall asleep: ? Put up shades or special blackout curtains to block light from outside. ? Use a white noise machine to block noise. ? Keep the temperature cool.  Exercise regularly as directed by your health care provider. Avoid exercising right before bedtime.  Use relaxation techniques to manage stress. Ask your health care provider to suggest some techniques that may work well for you. These may include: ? Breathing exercises. ? Routines to release muscle tension. ? Visualizing peaceful scenes.  Cut back on alcohol, caffeinated beverages, and cigarettes, especially close to bedtime. These can disrupt your sleep.  Do not overeat or eat spicy foods right before bedtime. This can lead to digestive discomfort that can make it hard for you to sleep.  Limit screen use before bedtime. This includes: ? Watching TV. ? Using your smartphone, tablet, and computer.  Stick to a routine. This can help you fall asleep faster. Try to do a quiet activity, brush your teeth, and go to bed at the same time each night.  Get out of bed if you are still awake after 15 minutes of trying to sleep. Keep the lights down, but try reading or doing a quiet activity. When you feel sleepy, go back to bed.  Make sure that you drive carefully. Avoid driving if you feel very sleepy.  Keep all follow-up appointments as directed by your health care provider. This is important. Contact a health care provider  if:  You are tired throughout the day or have trouble in your daily routine due to sleepiness.  You continue to have sleep problems  or your sleep problems get worse. Get help right away if:  You have serious thoughts about hurting yourself or someone else. This information is not intended to replace advice given to you by your health care provider. Make sure you discuss any questions you have with your health care provider. Document Released: 12/18/1999 Document Revised: 05/22/2015 Document Reviewed: 09/20/2013 Elsevier Interactive Patient Education  2018 [employer]. Donne Robillard M.D.

## 2017-03-10 ENCOUNTER — Ambulatory Visit (INDEPENDENT_AMBULATORY_CARE_PROVIDER_SITE_OTHER): Payer: BLUE CROSS/BLUE SHIELD | Admitting: Internal Medicine

## 2017-03-10 ENCOUNTER — Encounter: Payer: Self-pay | Admitting: Internal Medicine

## 2017-03-10 VITALS — BP 102/68 | HR 68 | Temp 98.2°F | Ht 68.25 in | Wt 156.2 lb

## 2017-03-10 DIAGNOSIS — Z23 Encounter for immunization: Secondary | ICD-10-CM

## 2017-03-10 DIAGNOSIS — Z3041 Encounter for surveillance of contraceptive pills: Secondary | ICD-10-CM | POA: Diagnosis not present

## 2017-03-10 DIAGNOSIS — Z0000 Encounter for general adult medical examination without abnormal findings: Secondary | ICD-10-CM

## 2017-03-10 DIAGNOSIS — G479 Sleep disorder, unspecified: Secondary | ICD-10-CM | POA: Diagnosis not present

## 2017-03-10 MED ORDER — NORETHIN ACE-ETH ESTRAD-FE 1.5-30 MG-MCG PO TABS: 1.0000 | ORAL_TABLET | Freq: Every day | ORAL | 3 refills | Status: DC

## 2017-03-10 NOTE — Patient Instructions (Addendum)
Wear a bike helmet.    Saline and flonase for chronic  rhinitis if needed .    If getting  frequent eye midraines let us know   OCPs can be a risk  In this situation.  Stay healthy .        Health Maintenance, Female Adopting a healthy lifestyle and getting preventive care can go a long way to promote health and wellness. Talk with your health care provider about what schedule of regular examinations is right for you. This is a good chance for you to check in with your provider about disease prevention and staying healthy. In between checkups, there are plenty of things you can do on your own. Experts have done a lot of research about which lifestyle changes and preventive measures are most likely to keep you healthy. Ask your health care provider for more information. Weight and diet Eat a healthy diet  Be sure to include plenty of vegetables, fruits, low-fat dairy products, and lean protein.  Do not eat a lot of foods high in solid fats, added sugars, or salt.  Get regular exercise. This is one of the most important things you can do for your health. ? Most adults should exercise for at least 150 minutes each week. The exercise should increase your heart rate and make you sweat (moderate-intensity exercise). ? Most adults should also do strengthening exercises at least twice a week. This is in addition to the moderate-intensity exercise.  Maintain a healthy weight  Body mass index (BMI) is a measurement that can be used to identify possible weight problems. It estimates body fat based on height and weight. Your health care provider can help determine your BMI and help you achieve or maintain a healthy weight.  For females 55 years of age and older: ? A BMI below 18.5 is considered underweight. ? A BMI of 18.5 to 24.9 is normal. ? A BMI of 25 to 29.9 is considered overweight. ? A BMI of 30 and above is considered obese.  Watch levels of cholesterol and blood lipids  You should  start having your blood tested for lipids and cholesterol at 21 years of age, then have this test every 5 years.  You may need to have your cholesterol levels checked more often if: ? Your lipid or cholesterol levels are high. ? You are older than 21 years of age. ? You are at high risk for heart disease.  Cancer screening Lung Cancer  Lung cancer screening is recommended for adults 63-30 years old who are at high risk for lung cancer because of a history of smoking.  A yearly low-dose CT scan of the lungs is recommended for people who: ? Currently smoke. ? Have quit within the past 15 years. ? Have at least a 30-pack-year history of smoking. A pack year is smoking an average of one pack of cigarettes a day for 1 year.  Yearly screening should continue until it has been 15 years since you quit.  Yearly screening should stop if you develop a health problem that would prevent you from having lung cancer treatment.  Breast Cancer  Practice breast self-awareness. This means understanding how your breasts normally appear and feel.  It also means doing regular breast self-exams. Let your health care provider know about any changes, no matter how small.  If you are in your 20s or 30s, you should have a clinical breast exam (CBE) by a health care provider every 1-3 years as part of  a regular health exam.  If you are 12 or older, have a CBE every year. Also consider having a breast X-ray (mammogram) every year.  If you have a family history of breast cancer, talk to your health care provider about genetic screening.  If you are at high risk for breast cancer, talk to your health care provider about having an MRI and a mammogram every year.  Breast cancer gene (BRCA) assessment is recommended for women who have family members with BRCA-related cancers. BRCA-related cancers include: ? Breast. ? Ovarian. ? Tubal. ? Peritoneal cancers.  Results of the assessment will determine the need  for genetic counseling and BRCA1 and BRCA2 testing.  Cervical Cancer Your health care provider may recommend that you be screened regularly for cancer of the pelvic organs (ovaries, uterus, and vagina). This screening involves a pelvic examination, including checking for microscopic changes to the surface of your cervix (Pap test). You may be encouraged to have this screening done every 3 years, beginning at age 59.  For women ages 37-65, health care providers may recommend pelvic exams and Pap testing every 3 years, or they may recommend the Pap and pelvic exam, combined with testing for human papilloma virus (HPV), every 5 years. Some types of HPV increase your risk of cervical cancer. Testing for HPV may also be done on women of any age with unclear Pap test results.  Other health care providers may not recommend any screening for nonpregnant women who are considered low risk for pelvic cancer and who do not have symptoms. Ask your health care provider if a screening pelvic exam is right for you.  If you have had past treatment for cervical cancer or a condition that could lead to cancer, you need Pap tests and screening for cancer for at least 20 years after your treatment. If Pap tests have been discontinued, your risk factors (such as having a new sexual partner) need to be reassessed to determine if screening should resume. Some women have medical problems that increase the chance of getting cervical cancer. In these cases, your health care provider may recommend more frequent screening and Pap tests.  Colorectal Cancer  This type of cancer can be detected and often prevented.  Routine colorectal cancer screening usually begins at 21 years of age and continues through 21 years of age.  Your health care provider may recommend screening at an earlier age if you have risk factors for colon cancer.  Your health care provider may also recommend using home test kits to check for hidden blood in  the stool.  A small camera at the end of a tube can be used to examine your colon directly (sigmoidoscopy or colonoscopy). This is done to check for the earliest forms of colorectal cancer.  Routine screening usually begins at age 52.  Direct examination of the colon should be repeated every 5-10 years through 21 years of age. However, you may need to be screened more often if early forms of precancerous polyps or small growths are found.  Skin Cancer  Check your skin from head to toe regularly.  Tell your health care provider about any new moles or changes in moles, especially if there is a change in a mole's shape or color.  Also tell your health care provider if you have a mole that is larger than the size of a pencil eraser.  Always use sunscreen. Apply sunscreen liberally and repeatedly throughout the day.  Protect yourself by wearing long sleeves,  pants, a wide-brimmed hat, and sunglasses whenever you are outside.  Heart disease, diabetes, and high blood pressure  High blood pressure causes heart disease and increases the risk of stroke. High blood pressure is more likely to develop in: ? People who have blood pressure in the high end of the normal range (130-139/85-89 mm Hg). ? People who are overweight or obese. ? People who are African American.  If you are 80-23 years of age, have your blood pressure checked every 3-5 years. If you are 58 years of age or older, have your blood pressure checked every year. You should have your blood pressure measured twice-once when you are at a hospital or clinic, and once when you are not at a hospital or clinic. Record the average of the two measurements. To check your blood pressure when you are not at a hospital or clinic, you can use: ? An automated blood pressure machine at a pharmacy. ? A home blood pressure monitor.  If you are between 15 years and 7 years old, ask your health care provider if you should take aspirin to prevent  strokes.  Have regular diabetes screenings. This involves taking a blood sample to check your fasting blood sugar level. ? If you are at a normal weight and have a low risk for diabetes, have this test once every three years after 21 years of age. ? If you are overweight and have a high risk for diabetes, consider being tested at a younger age or more often. Preventing infection Hepatitis B  If you have a higher risk for hepatitis B, you should be screened for this virus. You are considered at high risk for hepatitis B if: ? You were born in a country where hepatitis B is common. Ask your health care provider which countries are considered high risk. ? Your parents were born in a high-risk country, and you have not been immunized against hepatitis B (hepatitis B vaccine). ? You have HIV or AIDS. ? You use needles to inject street drugs. ? You live with someone who has hepatitis B. ? You have had sex with someone who has hepatitis B. ? You get hemodialysis treatment. ? You take certain medicines for conditions, including cancer, organ transplantation, and autoimmune conditions.  Hepatitis C  Blood testing is recommended for: ? Everyone born from [birth year] through 1964. ? Anyone with known risk factors for hepatitis C.  Sexually transmitted infections (STIs)  You should be screened for sexually transmitted infections (STIs) including gonorrhea and chlamydia if: ? You are sexually active and are younger than 21 years of age. ? You are older than 21 years of age and your health care provider tells you that you are at risk for this type of infection. ? Your sexual activity has changed since you were last screened and you are at an increased risk for chlamydia or gonorrhea. Ask your health care provider if you are at risk.  If you do not have HIV, but are at risk, it may be recommended that you take a prescription medicine daily to prevent HIV infection. This is called pre-exposure prophylaxis  (PrEP). You are considered at risk if: ? You are sexually active and do not regularly use condoms or know the HIV status of your partner(s). ? You take drugs by injection. ? You are sexually active with a partner who has HIV.  Talk with your health care provider about whether you are at high risk of being infected with HIV. If you  choose to begin PrEP, you should first be tested for HIV. You should then be tested every 3 months for as long as you are taking PrEP. Pregnancy  If you are premenopausal and you may become pregnant, ask your health care provider about preconception counseling.  If you may become pregnant, take 400 to 800 micrograms (mcg) of folic acid every day.  If you want to prevent pregnancy, talk to your health care provider about birth control (contraception). Osteoporosis and menopause  Osteoporosis is a disease in which the bones lose minerals and strength with aging. This can result in serious bone fractures. Your risk for osteoporosis can be identified using a bone density scan.  If you are 28 years of age or older, or if you are at risk for osteoporosis and fractures, ask your health care provider if you should be screened.  Ask your health care provider whether you should take a calcium or vitamin D supplement to lower your risk for osteoporosis.  Menopause may have certain physical symptoms and risks.  Hormone replacement therapy may reduce some of these symptoms and risks. Talk to your health care provider about whether hormone replacement therapy is right for you. Follow these instructions at home:  Schedule regular health, dental, and eye exams.  Stay current with your immunizations.  Do not use any tobacco products including cigarettes, chewing tobacco, or electronic cigarettes.  If you are pregnant, do not drink alcohol.  If you are breastfeeding, limit how much and how often you drink alcohol.  Limit alcohol intake to no more than 1 drink per day for  nonpregnant women. One drink equals 12 ounces of beer, 5 ounces of wine, or 1 ounces of hard liquor.  Do not use street drugs.  Do not share needles.  Ask your health care provider for help if you need support or information about quitting drugs.  Tell your health care provider if you often feel depressed.  Tell your health care provider if you have ever been abused or do not feel safe at home. This information is not intended to replace advice given to you by your health care provider. Make sure you discuss any questions you have with your health care provider. Document Released: 07/05/2010 Document Revised: 05/28/2015 Document Reviewed: 09/23/2014 Elsevier Interactive Patient Education  2018 [employer].         Insomnia Insomnia is a sleep disorder that makes it difficult to fall asleep or to stay asleep. Insomnia can cause tiredness (fatigue), low energy, difficulty concentrating, mood swings, and poor performance at work or school. There are three different ways to classify insomnia:  Difficulty falling asleep.  Difficulty staying asleep.  Waking up too early in the morning.  Any type of insomnia can be long-term (chronic) or short-term (acute). Both are common. Short-term insomnia usually lasts for three months or less. Chronic insomnia occurs at least three times a week for longer than three months. What are the causes? Insomnia may be caused by another condition, situation, or substance, such as:  Anxiety.  Certain medicines.  Gastroesophageal reflux disease (GERD) or other gastrointestinal conditions.  Asthma or other breathing conditions.  Restless legs syndrome, sleep apnea, or other sleep disorders.  Chronic pain.  Menopause. This may include hot flashes.  Stroke.  Abuse of alcohol, tobacco, or illegal drugs.  Depression.  Caffeine.  Neurological disorders, such as Alzheimer disease.  An overactive thyroid (hyperthyroidism).  The cause of  insomnia may not be known. What increases the risk? Risk  factors for insomnia include:  Gender. Women are more commonly affected than men.  Age. Insomnia is more common as you get older.  Stress. This may involve your professional or personal life.  Income. Insomnia is more common in people with lower income.  Lack of exercise.  Irregular work schedule or night shifts.  Traveling between different time zones.  What are the signs or symptoms? If you have insomnia, trouble falling asleep or trouble staying asleep is the main symptom. This may lead to other symptoms, such as:  Feeling fatigued.  Feeling nervous about going to sleep.  Not feeling rested in the morning.  Having trouble concentrating.  Feeling irritable, anxious, or depressed.  How is this treated? Treatment for insomnia depends on the cause. If your insomnia is caused by an underlying condition, treatment will focus on addressing the condition. Treatment may also include:  Medicines to help you sleep.  Counseling or therapy.  Lifestyle adjustments.  Follow these instructions at home:  Take medicines only as directed by your health care provider.  Keep regular sleeping and waking hours. Avoid naps.  Keep a sleep diary to help you and your health care provider figure out what could be causing your insomnia. Include: ? When you sleep. ? When you wake up during the night. ? How well you sleep. ? How rested you feel the next day. ? Any side effects of medicines you are taking. ? What you eat and drink.  Make your bedroom a comfortable place where it is easy to fall asleep: ? Put up shades or special blackout curtains to block light from outside. ? Use a white noise machine to block noise. ? Keep the temperature cool.  Exercise regularly as directed by your health care provider. Avoid exercising right before bedtime.  Use relaxation techniques to manage stress. Ask your health care provider to  suggest some techniques that may work well for you. These may include: ? Breathing exercises. ? Routines to release muscle tension. ? Visualizing peaceful scenes.  Cut back on alcohol, caffeinated beverages, and cigarettes, especially close to bedtime. These can disrupt your sleep.  Do not overeat or eat spicy foods right before bedtime. This can lead to digestive discomfort that can make it hard for you to sleep.  Limit screen use before bedtime. This includes: ? Watching TV. ? Using your smartphone, tablet, and computer.  Stick to a routine. This can help you fall asleep faster. Try to do a quiet activity, brush your teeth, and go to bed at the same time each night.  Get out of bed if you are still awake after 15 minutes of trying to sleep. Keep the lights down, but try reading or doing a quiet activity. When you feel sleepy, go back to bed.  Make sure that you drive carefully. Avoid driving if you feel very sleepy.  Keep all follow-up appointments as directed by your health care provider. This is important. Contact a health care provider if:  You are tired throughout the day or have trouble in your daily routine due to sleepiness.  You continue to have sleep problems or your sleep problems get worse. Get help right away if:  You have serious thoughts about hurting yourself or someone else. This information is not intended to replace advice given to you by your health care provider. Make sure you discuss any questions you have with your health care provider. Document Released: 12/18/1999 Document Revised: 05/22/2015 Document Reviewed: 09/20/2013 Elsevier Interactive Patient Education  2018 [employer].

## 2017-03-10 NOTE — Addendum Note (Signed)
Addended by: Maisie FusGREEN, Kiegan Macaraeg M on: 03/10/2017 11:59 AM   Modules accepted: Orders

## 2017-05-11 DIAGNOSIS — R293 Abnormal posture: Secondary | ICD-10-CM | POA: Diagnosis not present

## 2017-05-11 DIAGNOSIS — M9904 Segmental and somatic dysfunction of sacral region: Secondary | ICD-10-CM | POA: Diagnosis not present

## 2017-05-11 DIAGNOSIS — M9903 Segmental and somatic dysfunction of lumbar region: Secondary | ICD-10-CM | POA: Diagnosis not present

## 2017-05-11 DIAGNOSIS — M7918 Myalgia, other site: Secondary | ICD-10-CM | POA: Diagnosis not present

## 2017-05-15 DIAGNOSIS — M9903 Segmental and somatic dysfunction of lumbar region: Secondary | ICD-10-CM | POA: Diagnosis not present

## 2017-05-15 DIAGNOSIS — M7918 Myalgia, other site: Secondary | ICD-10-CM | POA: Diagnosis not present

## 2017-05-15 DIAGNOSIS — M9904 Segmental and somatic dysfunction of sacral region: Secondary | ICD-10-CM | POA: Diagnosis not present

## 2017-05-15 DIAGNOSIS — R293 Abnormal posture: Secondary | ICD-10-CM | POA: Diagnosis not present

## 2017-05-16 DIAGNOSIS — M9903 Segmental and somatic dysfunction of lumbar region: Secondary | ICD-10-CM | POA: Diagnosis not present

## 2017-05-16 DIAGNOSIS — R293 Abnormal posture: Secondary | ICD-10-CM | POA: Diagnosis not present

## 2017-05-16 DIAGNOSIS — M7918 Myalgia, other site: Secondary | ICD-10-CM | POA: Diagnosis not present

## 2017-05-16 DIAGNOSIS — M9904 Segmental and somatic dysfunction of sacral region: Secondary | ICD-10-CM | POA: Diagnosis not present

## 2017-05-17 DIAGNOSIS — M7918 Myalgia, other site: Secondary | ICD-10-CM | POA: Diagnosis not present

## 2017-05-17 DIAGNOSIS — M9903 Segmental and somatic dysfunction of lumbar region: Secondary | ICD-10-CM | POA: Diagnosis not present

## 2017-05-17 DIAGNOSIS — M9904 Segmental and somatic dysfunction of sacral region: Secondary | ICD-10-CM | POA: Diagnosis not present

## 2017-05-17 DIAGNOSIS — R293 Abnormal posture: Secondary | ICD-10-CM | POA: Diagnosis not present

## 2017-05-18 DIAGNOSIS — M9903 Segmental and somatic dysfunction of lumbar region: Secondary | ICD-10-CM | POA: Diagnosis not present

## 2017-05-18 DIAGNOSIS — R293 Abnormal posture: Secondary | ICD-10-CM | POA: Diagnosis not present

## 2017-05-18 DIAGNOSIS — M7918 Myalgia, other site: Secondary | ICD-10-CM | POA: Diagnosis not present

## 2017-05-18 DIAGNOSIS — M9904 Segmental and somatic dysfunction of sacral region: Secondary | ICD-10-CM | POA: Diagnosis not present

## 2017-05-22 DIAGNOSIS — R293 Abnormal posture: Secondary | ICD-10-CM | POA: Diagnosis not present

## 2017-05-22 DIAGNOSIS — M9903 Segmental and somatic dysfunction of lumbar region: Secondary | ICD-10-CM | POA: Diagnosis not present

## 2017-05-22 DIAGNOSIS — M7918 Myalgia, other site: Secondary | ICD-10-CM | POA: Diagnosis not present

## 2017-05-22 DIAGNOSIS — M9904 Segmental and somatic dysfunction of sacral region: Secondary | ICD-10-CM | POA: Diagnosis not present

## 2017-05-23 DIAGNOSIS — R293 Abnormal posture: Secondary | ICD-10-CM | POA: Diagnosis not present

## 2017-05-23 DIAGNOSIS — M9904 Segmental and somatic dysfunction of sacral region: Secondary | ICD-10-CM | POA: Diagnosis not present

## 2017-05-23 DIAGNOSIS — M7918 Myalgia, other site: Secondary | ICD-10-CM | POA: Diagnosis not present

## 2017-05-23 DIAGNOSIS — M9903 Segmental and somatic dysfunction of lumbar region: Secondary | ICD-10-CM | POA: Diagnosis not present

## 2017-05-24 DIAGNOSIS — R293 Abnormal posture: Secondary | ICD-10-CM | POA: Diagnosis not present

## 2017-05-24 DIAGNOSIS — M9904 Segmental and somatic dysfunction of sacral region: Secondary | ICD-10-CM | POA: Diagnosis not present

## 2017-05-24 DIAGNOSIS — M7918 Myalgia, other site: Secondary | ICD-10-CM | POA: Diagnosis not present

## 2017-05-24 DIAGNOSIS — M9903 Segmental and somatic dysfunction of lumbar region: Secondary | ICD-10-CM | POA: Diagnosis not present

## 2017-05-25 DIAGNOSIS — M7918 Myalgia, other site: Secondary | ICD-10-CM | POA: Diagnosis not present

## 2017-05-25 DIAGNOSIS — M9904 Segmental and somatic dysfunction of sacral region: Secondary | ICD-10-CM | POA: Diagnosis not present

## 2017-05-25 DIAGNOSIS — R293 Abnormal posture: Secondary | ICD-10-CM | POA: Diagnosis not present

## 2017-05-25 DIAGNOSIS — M9903 Segmental and somatic dysfunction of lumbar region: Secondary | ICD-10-CM | POA: Diagnosis not present

## 2017-05-30 DIAGNOSIS — R293 Abnormal posture: Secondary | ICD-10-CM | POA: Diagnosis not present

## 2017-05-30 DIAGNOSIS — M7918 Myalgia, other site: Secondary | ICD-10-CM | POA: Diagnosis not present

## 2017-05-30 DIAGNOSIS — M9904 Segmental and somatic dysfunction of sacral region: Secondary | ICD-10-CM | POA: Diagnosis not present

## 2017-05-30 DIAGNOSIS — M9903 Segmental and somatic dysfunction of lumbar region: Secondary | ICD-10-CM | POA: Diagnosis not present

## 2017-05-31 DIAGNOSIS — R293 Abnormal posture: Secondary | ICD-10-CM | POA: Diagnosis not present

## 2017-05-31 DIAGNOSIS — M9903 Segmental and somatic dysfunction of lumbar region: Secondary | ICD-10-CM | POA: Diagnosis not present

## 2017-05-31 DIAGNOSIS — M9904 Segmental and somatic dysfunction of sacral region: Secondary | ICD-10-CM | POA: Diagnosis not present

## 2017-05-31 DIAGNOSIS — M7918 Myalgia, other site: Secondary | ICD-10-CM | POA: Diagnosis not present

## 2017-06-01 DIAGNOSIS — R293 Abnormal posture: Secondary | ICD-10-CM | POA: Diagnosis not present

## 2017-06-01 DIAGNOSIS — M9904 Segmental and somatic dysfunction of sacral region: Secondary | ICD-10-CM | POA: Diagnosis not present

## 2017-06-01 DIAGNOSIS — M7918 Myalgia, other site: Secondary | ICD-10-CM | POA: Diagnosis not present

## 2017-06-01 DIAGNOSIS — M9903 Segmental and somatic dysfunction of lumbar region: Secondary | ICD-10-CM | POA: Diagnosis not present

## 2018-02-03 ENCOUNTER — Other Ambulatory Visit: Payer: Self-pay | Admitting: Internal Medicine

## 2018-03-12 NOTE — Progress Notes (Signed)
Chief Complaint  Patient presents with  . Annual Exam    Pt has no concerns today and is healthy and well    HPI: Patient  Teresa [name]  23 y.o. comes in today for Preventive Health Care visit   Junior at [employer] to do the [employer]  In august for a semester.    Economics. No major conceresn  No injuries  Had heart murmur checked out and   Check check in many years   Health Maintenance  Topic Date Due  . HIV Screening  11/22/2011  . INFLUENZA VACCINE  08/03/2017  . PAP SMEAR-Modifier  03/13/2018 (Originally 11/21/2017)  . PAP-Cervical Cytology Screening  03/13/2019 (Originally 11/21/2017)  . TETANUS/TDAP  08/21/2018   Health Maintenance Review LIFESTYLE:  Exercise:  Volleyball and active    Lifeguarding.  Tobacco/ETS:no Alcohol:  One or month  Sugar beverages: every other day  . Dr pepper.  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  Not ina ctve realtionship and low risk would   ROS:  GEN/ HEENT: No fever, significant weight changes sweats headaches vision problems hearing changes, CV/ PULM; No chest pain shortness of breath cough, syncope,edema  change in exercise tolerance. GI /GU: No adominal pain, vomiting, change in bowel habits. No blood in the stool. No significant GU symptoms. SKIN/HEME: ,no acute skin rashes suspicious lesions or bleeding. No lymphadenopathy, nodules, masses.  NEURO/ PSYCH:  No neurologic signs such as weakness numbness. No depression anxiety. IMM/ Allergy: No unusual infections.  Allergy .   REST of 12 system review negative except as per HPI   Past Medical History:  Diagnosis Date  . Anemia   . 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 ARTICULATION DISORDER 08/03/2009   Qualifier: History of  By: Regis Bill MD, Standley Brooking   . Dysmenorrhea in adolescent 08/23/2013   disc  nsaids preferred and fu if needed   . Functional murmur    eval at early  age    No past surgical history on file.  Family History  Problem Relation Age of Onset  . ADD / ADHD Mother   . Diabetes Mother        type 2   . ADD / ADHD Sister   . Hyperlipidemia Unknown   . Depression Unknown   . Hypertension Unknown   . Heart disease Paternal Uncle   . Heart attack Maternal Grandfather   . Heart attack Paternal Grandfather     Social History   Socioeconomic History  . Marital status: Single    Spouse name: Not on file  . Number of children: Not on file  . Years of education: Not on file  . Highest education level: Not on file  Occupational History  . Not on file  Social Needs  . Financial resource strain: Not on file  . Food insecurity:    Worry: Not on file    Inability: Not on file  . Transportation needs:    Medical: Not on file    Non-medical: Not on file  Tobacco Use  . Smoking status: Never Smoker  . Smokeless tobacco: Never Used  Substance and Sexual Activity  . Alcohol use: Yes    Alcohol/week: 0.0 standard drinks    Comment: rarely   . Drug use: No  . Sexual activity: Never  Lifestyle  . Physical activity:    Days per week: Not on file    Minutes per session: Not on file  . Stress: Not on file  Relationships  . Social connections:    Talks on phone: Not on file    Gets together: Not on file    Attends religious service: Not on file    Active member of club or organization: Not on file    Attends meetings of clubs or organizations: Not on file    Relationship status: Not on file  Other Topics Concern  . Not on file  Social History Narrative   [employer]  t [employer].    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 JUNEL FE 1.5/30  1.5-30 MG-MCG tablet TAKE 1 TABLET BY MOUTH EVERY DAY 84 tablet 1   No facility-administered medications prior to visit.      EXAM:  BP 118/66 (BP Location: Right Arm, Patient Position: Sitting, Cuff Size: Normal)   Pulse 86   Temp 97.8 F (36.6 C) (Oral)   Ht _0  (1.727 m)   Wt 155 lb 11.2 oz (70.6 kg)   LMP 03/05/2018 (Exact Date)   BMI 23.67 kg/m   Body mass index is 23.67 kg/m. Wt Readings from Last 3 Encounters:  03/13/18 155 lb 11.2 oz (70.6 kg)  03/10/17 156 lb 3.2 oz (70.9 kg)  01/09/17 155 lb 4.8 oz (70.4 kg)    Physical Exam: Vital signs reviewed GMW:NUUV is a well-developed well-nourished alert cooperative    who appearsr stated age in no acute distress.  HEENT: normocephalic atraumatic , Eyes: PERRL EOM's full, conjunctiva clear, Nares: paten,t no deformity discharge or tenderness., Ears: no deformity EAC's clear TMs with normal landmarks. Mouth: clear OP, no lesions, edema.  Moist mucous membranes. Dentition in adequate repair. NECK: supple without masses, thyromegaly or bruits. CHEST/PULM:  Clear to auscultation and percussion breath sounds equal no wheeze , rales or rhonchi. No chest wall deformities or tenderness. Breast: normal by inspection . No dimpling, discharge, masses, tenderness or discharge . CV: PMI is nondisplaced, S1 S2 no gallops, , rubs. Intermittent click  when supine ? Without murmur?  Peripheral pulses are full without delay.No JVD .  ABDOMEN: Bowel sounds normal nontender  No guard or rebound, no hepato splenomegal no CVA tenderness.  No hernia. Extremtities:  No clubbing cyanosis or edema, no acute joint swelling or redness no focal atrophy NEURO:  Oriented x3, cranial nerves 3-12 appear to be intact, no obvious focal weakness,gait within normal limits no abnormal reflexes or asymmetrical SKIN: No acute rashes normal turgor, color, no bruising or petechiae. PSYCH: Oriented, good eye contact, no obvious depression anxiety, cognition and  judgment appear normal. LN: no cervical axillary inguinal adenopathy Pelvic pap deferred  Per patient  Lab Results  Component Value Date   WBC 4.5 03/17/2016   HGB 12.5 03/17/2016   HCT 38.6 03/17/2016   PLT 274.0 03/17/2016   GLUCOSE 80 03/17/2016   CHOL 166 03/17/2016   TRIG 75.0 03/17/2016   HDL 62.40 03/17/2016   LDLCALC 89 03/17/2016  ALT 9 03/17/2016   AST 14 03/17/2016   NA 138 03/17/2016   K 4.6 03/17/2016   CL 105 03/17/2016   CREATININE 0.89 03/17/2016   BUN 14 03/17/2016   CO2 25 03/17/2016   TSH 1.59 03/17/2016    BP Readings from Last 3 Encounters:  03/13/18 118/66  03/10/17 102/68  01/09/17 110/62    Lab plan reviewed with patient   ASSESSMENT AND PLAN:  Discussed the following assessment and plan:  Visit for preventive health examination - Plan: Basic metabolic panel, CBC with Differential/Platelet, Hepatic function panel, Lipid panel, TSH, HIV Antibody (routine testing w rflx), RPR, Urine cytology ancillary only  Routine screening for STI (sexually transmitted infection) - Plan: HIV Antibody (routine testing w rflx), RPR, Urine cytology ancillary only  Oral contraceptive use - Plan: Basic metabolic panel, CBC with Differential/Platelet, Hepatic function panel, Lipid panel, TSH, HIV Antibody (routine testing w rflx), RPR, Urine cytology ancillary only  Need for influenza vaccination - Plan: Flu Vaccine QUAD 6+ mos PF IM (Fluarix Quad PF)  Need for Tdap vaccination - Plan: Tdap vaccine greater than or equal to 7yo IM Advise due for pap will do this year   Here   Or at college [facility]  Not high risk  Benefit more than risk of ocps   to continue.  Patient Care Team: Burnis Medin, MD as PCP - General Patient Instructions  Glad you are doing well.   Will notify you  of labs when available.   sti screening  Low risk .  Please plan on getting pap smear this year  Either here when conveneient or at  Student health . Etc.     Preventive Care for  [place], Female The transition to life after high school as a young adult can be a stressful time with many changes. You may start seeing a primary care physician instead of a pediatrician. This is the time when your health care becomes your responsibility. Preventive care refers to lifestyle choices and visits with your health care provider that can promote health and wellness. What does preventive care include?  A yearly physical exam. This is also called an annual wellness visit.  Dental exams once or twice a year.  Routine eye exams. Ask your health care provider how often you should have your eyes checked.  Personal lifestyle choices, including: ? Daily care of your teeth and gums. ? Regular physical activity. ? Eating a healthy diet. ? Avoiding tobacco and drug use. ? Avoiding or limiting alcohol use. ? Practicing safe sex. ? Taking vitamin and mineral supplements as recommended by your health care provider. What happens during an annual wellness visit? Preventive care starts with a yearly visit to your primary care physician. The services and screenings done by your health care provider during your annual wellness visit will depend on your overall health, lifestyle risk factors, and family history of disease. Counseling Your health care provider may ask you questions about:  Past medical problems and your family's medical history.  Medicines or supplements you take.  Health insurance and access to health care.  Alcohol, tobacco, and drug use.  Your safety at home, work, or school.  Access to firearms.  Emotional well-being and how you cope with stress.  Relationship well-being.  Diet, exercise, and sleep habits.  Your sexual health and activity.  Your methods of birth control.  Your menstrual cycle.  Your pregnancy history. Screening You may have the following tests or measurements:  Height,  weight, and BMI.  Blood pressure.  Lipid and cholesterol  levels.  Tuberculosis skin test.  Skin exam.  Vision and hearing tests.  Screening test for hepatitis.  Screening tests for sexually transmitted diseases (STDs), if you are at risk.  BRCA-related cancer screening. This may be done if you have a family history of breast, ovarian, tubal, or peritoneal cancers.  Pelvic exam and Pap test. This may be done every 3 years starting at age 80. Vaccines Your health care provider may recommend certain vaccines, such as:  Influenza vaccine. This is recommended every year.  Tetanus, diphtheria, and acellular pertussis (Tdap, Td) vaccine. You may need a Td booster every 10 years.  Varicella vaccine. You may need this if you have not been vaccinated.  HPV vaccine. If you are 11 or younger, you may need three doses over 6 months.  Measles, mumps, and rubella (MMR) vaccine. You may need at least one dose of MMR. You may also need a second dose.  Pneumococcal 13-valent conjugate (PCV13) vaccine. You may need this if you have certain conditions and were not previously vaccinated.  Pneumococcal polysaccharide (PPSV23) vaccine. You may need one or two doses if you smoke cigarettes or if you have certain conditions.  Meningococcal vaccine. One dose is recommended if you are age 37-21 years and a first-year college student living in a residence hall, or if you have one of several medical conditions. You may also need additional booster doses.  Hepatitis A vaccine. You may need this if you have certain conditions or if you travel or work in places where you may be exposed to hepatitis A.  Hepatitis B vaccine. You may need this if you have certain conditions or if you travel or work in places where you may be exposed to hepatitis B.  Haemophilus influenzae type b (Hib) vaccine. You may need this if you have certain risk factors. Talk to your health care provider about which screenings and vaccines you need and how often you need them. What steps can I  take to develop healthy behaviors?      Have regular preventive health care visits with your primary care physician and dentist.  Eat a healthy diet.  Drink enough fluid to keep your urine pale yellow.  Stay active. Exercise at least 30 minutes 5 or more days of the week.  Use alcohol responsibly.  Maintain a healthy weight.  Do not use any products that contain nicotine, such as cigarettes, chewing tobacco, and e-cigarettes. If you need help quitting, ask your health care provider.  Do not use drugs.  Practice safe sex.  Use birth control (contraception) to prevent unwanted pregnancy. If you plan to become pregnant, see your health care provider for a pre-conception visit.  Find healthy ways to manage stress. How can I protect myself from injury? Injuries from violence or accidents are the leading cause of death among young adults and can often be prevented. Take these steps to help protect yourself:  Always wear your seat belt while driving or riding in a vehicle.  Do not drive if you have been drinking alcohol. Do not ride with someone who has been drinking.  Do not drive when you are tired or distracted. Do not text while driving.  Wear a helmet and other protective equipment during sports activities.  If you have firearms in your house, make sure you follow all gun safety procedures.  Seek help if you have been bullied, physically abused, or sexually abused.  Use  the Internet responsibly to avoid dangers such as online bullying and online sexual predators. What can I do to cope with stress? Young adults may face many new challenges that can be stressful, such as finding a job, going to college, moving away from home, managing money, being in a relationship, getting married, and having children. To manage stress:  Avoid known stressful situations when you can.  Exercise regularly.  Find a stress-reducing activity that works best for you. Examples include  meditation, yoga, listening to music, or reading.  Spend time in nature.  Keep a journal to write about your stress and how you respond.  Talk to your health care provider about stress. He or she may suggest counseling.  Spend time with supportive friends or family.  Do not cope with stress by: ? Drinking alcohol or using drugs. ? Smoking cigarettes. ? Eating. Where can I get more information? Learn more about preventive care and healthy habits from:  [place] and Gynecologists: KaraokeExchange.nl  U.S. Probation officer Task Force: StageSync.si  National Adolescent and [place]: StrategicRoad.nl  American Academy of Pediatrics Bright Futures: https://brightfutures.MemberVerification.co.za  Society for Adolescent Health and Medicine: MoralBlog.co.za.aspx  PodExchange.nl: ToyLending.fr This information is not intended to replace advice given to you by your health care provider. Make sure you discuss any questions you have with your health care provider. Document Released: 05/07/2015 Document Revised: 08/02/2016 Document Reviewed: 05/07/2015 Elsevier Interactive Patient Education  2019 [place].  M.D.

## 2018-03-13 ENCOUNTER — Other Ambulatory Visit (HOSPITAL_COMMUNITY)
Admission: RE | Admit: 2018-03-13 | Discharge: 2018-03-13 | Disposition: A | Discharge status: Home / Self Care | Payer: BLUE CROSS/BLUE SHIELD | Source: Ambulatory Visit | Attending: Internal Medicine | Admitting: Internal Medicine

## 2018-03-13 ENCOUNTER — Ambulatory Visit (INDEPENDENT_AMBULATORY_CARE_PROVIDER_SITE_OTHER): Payer: BLUE CROSS/BLUE SHIELD | Admitting: Internal Medicine

## 2018-03-13 ENCOUNTER — Encounter: Payer: Self-pay | Admitting: Internal Medicine

## 2018-03-13 VITALS — BP 118/66 | HR 86 | Temp 97.8°F | Ht 68.0 in | Wt 155.7 lb

## 2018-03-13 DIAGNOSIS — Z0000 Encounter for general adult medical examination without abnormal findings: Secondary | ICD-10-CM

## 2018-03-13 DIAGNOSIS — Z113 Encounter for screening for infections with a predominantly sexual mode of transmission: Secondary | ICD-10-CM | POA: Diagnosis not present

## 2018-03-13 DIAGNOSIS — Z23 Encounter for immunization: Secondary | ICD-10-CM | POA: Diagnosis not present

## 2018-03-13 DIAGNOSIS — Z3041 Encounter for surveillance of contraceptive pills: Secondary | ICD-10-CM | POA: Insufficient documentation

## 2018-03-13 LAB — CBC WITH DIFFERENTIAL/PLATELET
Basophils Absolute: 0 10*3/uL (ref 0.0–0.1)
Basophils Relative: 0.8 % (ref 0.0–3.0)
Eosinophils Absolute: 0.1 10*3/uL (ref 0.0–0.7)
Eosinophils Relative: 2.5 % (ref 0.0–5.0)
HCT: 40.5 % (ref 36.0–46.0)
Hemoglobin: 13.3 g/dL (ref 12.0–15.0)
Lymphocytes Relative: 34.3 % (ref 12.0–46.0)
Lymphs Abs: 1.7 10*3/uL (ref 0.7–4.0)
MCHC: 32.7 g/dL (ref 30.0–36.0)
MCV: 83.7 fl (ref 78.0–100.0)
Monocytes Absolute: 0.4 10*3/uL (ref 0.1–1.0)
Monocytes Relative: 7.2 % (ref 3.0–12.0)
NEUTROS ABS: 2.7 10*3/uL (ref 1.4–7.7)
Neutrophils Relative %: 55.2 % (ref 43.0–77.0)
Platelets: 253 10*3/uL (ref 150.0–400.0)
RBC: 4.84 Mil/uL (ref 3.87–5.11)
RDW: 14.1 % (ref 11.5–15.5)
WBC: 4.9 10*3/uL (ref 4.0–10.5)

## 2018-03-13 LAB — HEPATIC FUNCTION PANEL
ALT: 9 U/L (ref 0–35)
AST: 11 U/L (ref 0–37)
Albumin: 4.2 g/dL (ref 3.5–5.2)
Alkaline Phosphatase: 51 U/L (ref 39–117)
Bilirubin, Direct: 0.1 mg/dL (ref 0.0–0.3)
TOTAL PROTEIN: 6.5 g/dL (ref 6.0–8.3)
Total Bilirubin: 0.4 mg/dL (ref 0.2–1.2)

## 2018-03-13 LAB — BASIC METABOLIC PANEL
BUN: 17 mg/dL (ref 6–23)
CALCIUM: 9.1 mg/dL (ref 8.4–10.5)
CO2: 25 mEq/L (ref 19–32)
CREATININE: 1.03 mg/dL (ref 0.40–1.20)
Chloride: 104 mEq/L (ref 96–112)
GFR: 67.44 mL/min (ref >60.00)
Glucose, Bld: 92 mg/dL (ref 70–99)
Potassium: 4.4 mEq/L (ref 3.5–5.1)
SODIUM: 137 meq/L (ref 135–145)

## 2018-03-13 LAB — LIPID PANEL
Cholesterol: 187 mg/dL (ref 0–200)
HDL: 66.5 mg/dL (ref >39.00)
LDL Cholesterol: 95 mg/dL (ref 0–99)
NonHDL: 120.85
Total CHOL/HDL Ratio: 3
Triglycerides: 128 mg/dL (ref 0.0–149.0)
VLDL: 25.6 mg/dL (ref 0.0–40.0)

## 2018-03-13 LAB — TSH: TSH: 3.6 u[IU]/mL (ref 0.35–4.50)

## 2018-03-13 NOTE — Patient Instructions (Signed)
Glad you are doing well.   Will notify you  of labs when available.   sti screening  Low risk .  Please plan on getting pap smear this year  Either here when conveneient or at  Student health . Etc.     Preventive Care for [name], Female The transition to life after high school as a young adult can be a stressful time with many changes. You may start seeing a primary care physician instead of a pediatrician. This is the time when your health care becomes your responsibility. Preventive care refers to lifestyle choices and visits with your health care provider that can promote health and wellness. What does preventive care include?  A yearly physical exam. This is also called an annual wellness visit.  Dental exams once or twice a year.  Routine eye exams. Ask your health care provider how often you should have your eyes checked.  Personal lifestyle choices, including: ? Daily care of your teeth and gums. ? Regular physical activity. ? Eating a healthy diet. ? Avoiding tobacco and drug use. ? Avoiding or limiting alcohol use. ? Practicing safe sex. ? Taking vitamin and mineral supplements as recommended by your health care provider. What happens during an annual wellness visit? Preventive care starts with a yearly visit to your primary care physician. The services and screenings done by your health care provider during your annual wellness visit will depend on your overall health, lifestyle risk factors, and family history of disease. Counseling Your health care provider may ask you questions about:  Past medical problems and your family's medical history.  Medicines or supplements you take.  Health insurance and access to health care.  Alcohol, tobacco, and drug use.  Your safety at home, work, or school.  Access to firearms.  Emotional well-being and how you cope with stress.  Relationship well-being.  Diet, exercise, and sleep habits.  Your sexual health and  activity.  Your methods of birth control.  Your menstrual cycle.  Your pregnancy history. Screening You may have the following tests or measurements:  Height, weight, and BMI.  Blood pressure.  Lipid and cholesterol levels.  Tuberculosis skin test.  Skin exam.  Vision and hearing tests.  Screening test for hepatitis.  Screening tests for sexually transmitted diseases (STDs), if you are at risk.  BRCA-related cancer screening. This may be done if you have a family history of breast, ovarian, tubal, or peritoneal cancers.  Pelvic exam and Pap test. This may be done every 3 years starting at age 46. Vaccines Your health care provider may recommend certain vaccines, such as:  Influenza vaccine. This is recommended every year.  Tetanus, diphtheria, and acellular pertussis (Tdap, Td) vaccine. You may need a Td booster every 10 years.  Varicella vaccine. You may need this if you have not been vaccinated.  HPV vaccine. If you are 32 or younger, you may need three doses over 6 months.  Measles, mumps, and rubella (MMR) vaccine. You may need at least one dose of MMR. You may also need a second dose.  Pneumococcal 13-valent conjugate (PCV13) vaccine. You may need this if you have certain conditions and were not previously vaccinated.  Pneumococcal polysaccharide (PPSV23) vaccine. You may need one or two doses if you smoke cigarettes or if you have certain conditions.  Meningococcal vaccine. One dose is recommended if you are age 14-21 years and a first-year college student living in a residence hall, or if you have one of several medical  conditions. You may also need additional booster doses.  Hepatitis A vaccine. You may need this if you have certain conditions or if you travel or work in places where you may be exposed to hepatitis A.  Hepatitis B vaccine. You may need this if you have certain conditions or if you travel or work in places where you may be exposed to hepatitis  B.  Haemophilus influenzae type b (Hib) vaccine. You may need this if you have certain risk factors. Talk to your health care provider about which screenings and vaccines you need and how often you need them. What steps can I take to develop healthy behaviors?      Have regular preventive health care visits with your primary care physician and dentist.  Eat a healthy diet.  Drink enough fluid to keep your urine pale yellow.  Stay active. Exercise at least 30 minutes 5 or more days of the week.  Use alcohol responsibly.  Maintain a healthy weight.  Do not use any products that contain nicotine, such as cigarettes, chewing tobacco, and e-cigarettes. If you need help quitting, ask your health care provider.  Do not use drugs.  Practice safe sex.  Use birth control (contraception) to prevent unwanted pregnancy. If you plan to become pregnant, see your health care provider for a pre-conception visit.  Find healthy ways to manage stress. How can I protect myself from injury? Injuries from violence or accidents are the leading cause of death among young adults and can often be prevented. Take these steps to help protect yourself:  Always wear your seat belt while driving or riding in a vehicle.  Do not drive if you have been drinking alcohol. Do not ride with someone who has been drinking.  Do not drive when you are tired or distracted. Do not text while driving.  Wear a helmet and other protective equipment during sports activities.  If you have firearms in your house, make sure you follow all gun safety procedures.  Seek help if you have been bullied, physically abused, or sexually abused.  Use the Internet responsibly to avoid dangers such as online bullying and online sexual predators. What can I do to cope with stress? Young adults may face many new challenges that can be stressful, such as finding a job, going to college, moving away from home, managing money, being in a  relationship, getting married, and having children. To manage stress:  Avoid known stressful situations when you can.  Exercise regularly.  Find a stress-reducing activity that works best for you. Examples include meditation, yoga, listening to music, or reading.  Spend time in nature.  Keep a journal to write about your stress and how you respond.  Talk to your health care provider about stress. He or she may suggest counseling.  Spend time with supportive friends or family.  Do not cope with stress by: ? Drinking alcohol or using drugs. ? Smoking cigarettes. ? Eating. Where can I get more information? Learn more about preventive care and healthy habits from:  [place] and Gynecologists: KaraokeExchange.nl  U.S. Probation officer Task Force: StageSync.si  National Adolescent and [place]: StrategicRoad.nl  American Academy of Pediatrics Bright Futures: https://brightfutures.MemberVerification.co.za  Society for Adolescent Health and Medicine: MoralBlog.co.za.aspx  PodExchange.nl: ToyLending.fr This information is not intended to replace advice given to you by your health care provider. Make sure you discuss any questions you have with your health care provider. Document Released: 05/07/2015 Document Revised: 08/02/2016  Document Reviewed: 05/07/2015 Elsevier Interactive Patient Education  [employer].

## 2018-03-14 LAB — RPR: RPR Ser Ql: NONREACTIVE

## 2018-03-14 LAB — URINE CYTOLOGY ANCILLARY ONLY
Chlamydia: NEGATIVE
Neisseria Gonorrhea: NEGATIVE

## 2018-03-14 LAB — HIV ANTIBODY (ROUTINE TESTING W REFLEX): HIV 1&2 Ab, 4th Generation: NONREACTIVE

## 2018-06-03 ENCOUNTER — Other Ambulatory Visit: Payer: Self-pay | Admitting: Internal Medicine

## 2018-06-27 ENCOUNTER — Telehealth: Payer: Self-pay | Admitting: General Practice

## 2018-06-27 ENCOUNTER — Other Ambulatory Visit: Payer: Self-pay

## 2018-06-27 ENCOUNTER — Ambulatory Visit (INDEPENDENT_AMBULATORY_CARE_PROVIDER_SITE_OTHER): Payer: BC Managed Care – PPO | Admitting: Internal Medicine

## 2018-06-27 ENCOUNTER — Ambulatory Visit: Payer: Self-pay

## 2018-06-27 ENCOUNTER — Encounter: Payer: Self-pay | Admitting: Internal Medicine

## 2018-06-27 ENCOUNTER — Other Ambulatory Visit: Payer: BLUE CROSS/BLUE SHIELD

## 2018-06-27 ENCOUNTER — Telehealth: Payer: Self-pay

## 2018-06-27 DIAGNOSIS — Z20822 Contact with and (suspected) exposure to covid-19: Secondary | ICD-10-CM

## 2018-06-27 DIAGNOSIS — Z20828 Contact with and (suspected) exposure to other viral communicable diseases: Secondary | ICD-10-CM

## 2018-06-27 DIAGNOSIS — R6889 Other general symptoms and signs: Secondary | ICD-10-CM | POA: Diagnosis not present

## 2018-06-27 DIAGNOSIS — J029 Acute pharyngitis, unspecified: Secondary | ICD-10-CM | POA: Diagnosis not present

## 2018-06-27 NOTE — Telephone Encounter (Signed)
Mother returned call and patient scheduled for 06/27/2018 at 2:30pm.

## 2018-06-27 NOTE — Telephone Encounter (Signed)
LVM for pt to return call to schedule covid testing.

## 2018-06-27 NOTE — Addendum Note (Signed)
Addended by: Dimple Nanas on: 06/27/2018 12:39 PM   Modules accepted: Orders

## 2018-06-27 NOTE — Telephone Encounter (Signed)
  Please advise not sure if we can send anywhere else ?   Copied from [place] [phone]. Topic: General - Other >> Jun 27, 2018 12:43 PM Oneta Rack wrote: Mother inquiring where daughter can have rapid COVID test done. Mother states she's having surgery on Monday and time is sensitive. Daughter is scheduled today at [facility] [place] testing site at 2:30pm and mother is concerned results will not be back in enough time. Please advise

## 2018-06-27 NOTE — Telephone Encounter (Signed)
Not aware   But can check with the [facility] system and see if they do rapid testing   The problem is depending on the type  test the sensitivity  Of the may not be as good as the other tests.   I will check  And see if can find out   Either way   Isolation is advised .Marland KitchenMarland KitchenMarland Kitchen

## 2018-06-27 NOTE — Telephone Encounter (Signed)
Patient scheduled for virtual visit with Dr. Regis Bill at [facility]

## 2018-06-27 NOTE — Progress Notes (Signed)
Virtual Visit via Video Note  I connected with@ on 06/27/18 at 12:00 PM EDT by a video enabled telemedicine application and verified that I am speaking with the correct person using two identifiers. Location patient: home Location provider:work or home office Persons participating in the virtual visit: patient, provider mom   [place] national recommendations  regarding COVID 19 pandemic   video visit is advised over in office visit for this patient.  Patient aware  of the limitations of evaluation and management by telemedicine and  availability of in person appointments. and agreed to proceed.   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 Had worked along her shifts   Over the past 5 days .   No fever  scratchy sore throat  ocass ? If cough not really sick.   Lives at home  Family has  Been "isolated"  But mom to have surgery next week.   Patient reports that the information she has is that index person had [facility] and father directed her to be tested  Cause of  Exposure possibilities.  Employer asks all be tested and will have a clean up .  Does not know if  Health dept [place] in [place]    ROS: See pertinent positives and negatives per HPI.  Past Medical History:  Diagnosis Date  . Anemia   . 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 ARTICULATION DISORDER 08/03/2009   Qualifier: History of  By: Fabian SharpPanosh MD, Neta MendsWanda K   . Dysmenorrhea in adolescent 08/23/2013   disc  nsaids preferred and fu if needed   . 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 No pertinent surgical history.  Family History  Problem Relation Age of Onset  . ADD / ADHD Mother   . Diabetes Mother        type 2   . ADD / ADHD Sister   . Hyperlipidemia Unknown   . Depression Unknown   . Hypertension Unknown   . Heart disease  Paternal Uncle   . Heart attack Maternal Grandfather   . Heart attack Paternal Grandfather     Social History   Tobacco Use  . Smoking status: Never Smoker  . Smokeless tobacco: Never Used  Substance Use Topics  . Alcohol use: Yes    Alcohol/week: 0.0 standard drinks    Comment: rarely   . Drug use: No      Current Outpatient Medications:  .  JUNEL FE 1.5/30 1.5-30 MG-MCG tablet, TAKE 1 TABLET BY MOUTH EVERY DAY, Disp: 28 tablet, Rfl: 5  EXAM: BP Readings from Last 3 Encounters:  03/13/18 118/66  03/10/17 102/68  01/09/17 110/62    VITALS per patient if applicable:  GENERAL: alert, oriented, appears well and in no acute distress HEENT: atraumatic, conjunttiva clear, no obvious abnormalities on inspection of external nose and ears NECK: normal movements of the head and neck LUNGS: on inspection no signs of respiratory distress, breathing rate appears normal, no obvious gross SOB, gasping or wheezing CV: no obvious cyanosis MS: moves all visible extremities without noticeable abnormality PSYCH/NEURO: pleasant and cooperative, no obvious depression or anxiety, speech and thought processing grossly intact   ASSESSMENT AND PLAN:  Discussed the following assessment  and plan:    ICD-10-CM   1. Close Exposure to Covid-19 Virus  Z20.828   2. Sore throat minor  J02.9    Work place  Recent  Minimal resp sx  If at all   Uncertain significance    Testing asap  Disc poss TAT  Mom to be tested  Tomorrow   Implement physical distance and isolation .    Counseled. Arrange to get tested today.      Expectant management and discussion of plan and treatment with opportunity to ask questions and all were answered. The patient agreed with the plan and demonstrated an understanding of the instructions.   Advised to call back or seek an in-person evaluation if worsening  or having  further concerns .    Shanon Ace, MD

## 2018-06-27 NOTE — Telephone Encounter (Signed)
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 She says as far as contact goes, they all have close contact with each other. She says they sit 6 feet apart, but they all share the same chairs, same locker room, same break room. She says the last time she worked with her was on Monday, but she has worked 3 shifts of 7-8 hours with the co-worker. The patient says she has a slight sore throat and her mother says she feels warm, but have not checked her temperature. She says her employer is requiring all of them to be tested. I called the office and spoke to [place], [facility] who asked to speak to the patient's mom, the call was connected successfully.  Answer Assessment - Initial Assessment Questions 1. CLOSE CONTACT: "Who is the person with the confirmed or suspected COVID-19 infection that you were exposed to?"     Co-worker tested positive 2. PLACE of CONTACT: "Where were you when you were exposed to COVID-19?" (e.g., home, school, medical waiting room; which city?)    [place], [place]  3. TYPE of CONTACT: "How much contact was there?" (e.g., sitting next to, live in same house, work in same office, same building)     Sit in the same Financial trader, same break room, same locker area 4. DURATION of CONTACT: "How long were you in contact with the COVID-19 patient?" (e.g., a few seconds, passed by person, a few minutes, live with the patient)    3 shifts of 7-8 hour shifts  5. DATE of CONTACT: "When did you have contact with a COVID-19 patient?" (e.g., how many days ago)     Monday was the last day with the worker 6. TRAVEL: "Have you traveled out of the country recently?" If so, "When and where?"     * Also ask about out-of-state travel, since the CDC has identified some high-risk cities for community spread in the [place].     * Note: Travel becomes less relevant if there is widespread community transmission where the patient lives.  Not out of state 7. COMMUNITY SPREAD: "Are there lots of cases of COVID-19 (community spread) where you live?" (See public health department website, if unsure)       No idea 8. SYMPTOMS: "Do you have any symptoms?" (e.g., fever, cough, breathing difficulty)     Slight sore throat 9. PREGNANCY OR POSTPARTUM: "Is there any chance you are pregnant?" "When was your last menstrual period?" "Did you deliver in the last 2 weeks?"     No, LMP yesterday 10. HIGH RISK: "Do you have any heart or lung problems? Do you have a weak immune system?" (e.g., CHF, COPD, asthma, HIV positive, chemotherapy, renal failure, diabetes mellitus, sickle cell anemia)       Heart murmur  Protocols used: CORONAVIRUS (COVID-19) EXPOSURE-A-AH

## 2018-06-28 NOTE — Telephone Encounter (Signed)
Pt has already got tested and surgery was postponed

## 2018-07-02 LAB — NOVEL CORONAVIRUS, NAA: SARS-CoV-2, NAA: NOT DETECTED

## 2018-07-23 DIAGNOSIS — Z6823 Body mass index (BMI) 23.0-23.9, adult: Secondary | ICD-10-CM | POA: Diagnosis not present

## 2018-07-23 DIAGNOSIS — Z01419 Encounter for gynecological examination (general) (routine) without abnormal findings: Secondary | ICD-10-CM | POA: Diagnosis not present

## 2018-11-19 ENCOUNTER — Other Ambulatory Visit: Payer: Self-pay | Admitting: Internal Medicine

## 2018-12-27 ENCOUNTER — Ambulatory Visit: Payer: BC Managed Care – PPO | Attending: Internal Medicine

## 2018-12-27 DIAGNOSIS — Z20822 Contact with and (suspected) exposure to covid-19: Secondary | ICD-10-CM

## 2018-12-27 DIAGNOSIS — Z20828 Contact with and (suspected) exposure to other viral communicable diseases: Secondary | ICD-10-CM | POA: Diagnosis not present

## 2018-12-29 LAB — NOVEL CORONAVIRUS, NAA: SARS-CoV-2, NAA: NOT DETECTED

## 2019-01-07 ENCOUNTER — Ambulatory Visit: Payer: BC Managed Care – PPO | Attending: Internal Medicine

## 2019-01-07 DIAGNOSIS — Z20822 Contact with and (suspected) exposure to covid-19: Secondary | ICD-10-CM

## 2019-01-08 LAB — NOVEL CORONAVIRUS, NAA: SARS-CoV-2, NAA: NOT DETECTED

## 2019-03-11 ENCOUNTER — Other Ambulatory Visit: Payer: Self-pay

## 2019-03-11 NOTE — Progress Notes (Signed)
Chief Complaint  Patient presents with  . Annual Exam    Pt has no concerns today    HPI: Patient  Teresa [name]  24 y.o. 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 PAP-Cervical Cytology Screening  03/13/2019 (Originally 11/21/2017)  . INFLUENZA VACCINE  04/03/2019 (Originally 08/04/2018)  . PAP SMEAR-Modifier  07/09/2021  . TETANUS/TDAP  03/12/2028  . HIV Screening  Completed   Health Maintenance Review LIFESTYLE:  Exercise:  Aerobic class  Virtual  Tobacco/ETS:no Alcohol:  1 per week  Sugar beverages:  ocass  Sleep: about 8  Drug use: no HH of at home for this semester  School work final semester  At home .  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 Cards  GEN/ HEENT: No fever, significant weight changes sweats headaches vision problems hearing changes, CV/ PULM; No chest pain shortness of breath cough, syncope( x 1 with blood donation in fall) ,edema  change in exercise tolerance. GI /GU: No adominal pain, vomiting, change in bowel habits. No blood in the stool. No significant GU symptoms. SKIN/HEME: ,no acute skin rashes suspicious lesions or bleeding. No lymphadenopathy, nodules, masses.  NEURO/ PSYCH:  No neurologic signs such as weakness numbness. No depression anxiety. IMM/ Allergy: No unusual infections.  Allergy .   REST of 12 system review negative except as per HPI   Past Medical History:  Diagnosis Date  . Anemia   . 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 ARTICULATION DISORDER 08/03/2009   Qualifier: History of  By: Fabian SharpPanosh MD, Neta MendsWanda K   . Dysmenorrhea in adolescent 08/23/2013   disc  nsaids preferred and fu if needed   .  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 No pertinent surgical history.  Family History  Problem Relation Age of Onset  . ADD / ADHD Mother   . Diabetes Mother        type 2   . ADD / ADHD Sister   . Hyperlipidemia Unknown   . Depression Unknown   . Hypertension Unknown   . Heart disease Paternal Uncle   . Heart attack Maternal Grandfather   . Heart attack Paternal Grandfather     Social History   Socioeconomic History  . Marital status: Single    Spouse name: Not on file  . Number of children: Not on file  . Years of education: Not on file  . Highest education level: Not on file  Occupational History  . Not on file  Tobacco Use  . Smoking status: Never Smoker  . Smokeless tobacco: Never Used  Substance and Sexual Activity  . Alcohol use: Yes    Alcohol/week: 0.0 standard drinks    Comment: rarely   . Drug use: No  . Sexual activity: Never  Other Topics Concern  . Not on file  Social History Narrative   [employer]    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 Difficulty of Paying Living Expenses: Not on file  Food Insecurity:   . Worried About Programme researcher, broadcasting/film/video in the Last Year: Not on file  . Ran Out of Food in the Last Year: Not on file  Transportation Needs:   . Lack of Transportation (Medical): Not on file  . Lack of Transportation (Non-Medical): Not on file  Physical Activity:   . Days of Exercise per Week: Not on file  . Minutes of Exercise per Session: Not on file  Stress:   . Feeling of Stress : Not on file  Social Connections:   . Frequency of Communication with Friends and Family: Not on file  . Frequency of Social Gatherings with  Friends and Family: Not on file  . Attends Religious Services: Not on file  . Active Member of Clubs or Organizations: Not on file  . Attends Banker Meetings: Not on file  . Marital Status: Not on file    Outpatient Medications Prior to Visit  Medication Sig Dispense Refill  . JUNEL FE 1.5/30 1.5-30 MG-MCG tablet TAKE 1 TABLET BY MOUTH EVERY DAY 84 tablet 1   No facility-administered medications prior to visit.     EXAM:  BP 122/76 (BP Location: Right Arm, Patient Position: Sitting, Cuff Size: Normal)   Pulse 95   Temp 97.8 F (36.6 C) (Temporal)   Ht 5' 8.75" (1.746 m)   Wt 158 lb (71.7 kg)   SpO2 98%   BMI 23.50 kg/m   Body mass index is 23.5 kg/m. Wt Readings from Last 3 Encounters:  03/12/19 158 lb (71.7 kg)  03/13/18 155 lb 11.2 oz (70.6 kg)  03/10/17 156 lb 3.2 oz (70.9 kg)    Physical Exam: Vital signs reviewed QMG:QQPY is a well-developed well-nourished alert cooperative    who appearsr stated age in no acute distress.  HEENT: normocephalic atraumatic , Eyes: PERRL EOM's full, conjunctiva clear, deformity discharge , Ears: no deformity EAC's clear TMs with normal landmarks. Mouth: clear masked  NECK: supple without masses, thyromegaly or bruits. CHEST/PULM:  Clear to auscultation and percussion breath sounds equal no wheeze , rales or rhonchi. No chest wall deformities or tenderness. Breast: normal by inspection . No dimpling, discharge, masses, tenderness or discharge . CV: PMI is nondisplaced, S1 S2 no gallops, murmurs, rubs dont hear murmur . Peripheral pulses are full without delay.No JVD .  ABDOMEN: Bowel sounds normal nontender  No guard or rebound, no hepato splenomegal no CVA tenderness.  No hernia. Extremtities:  No clubbing cyanosis or edema, no acute joint swelling or redness no focal atrophy NEURO:  Oriented x3, cranial nerves 3-12 appear to be intact, no obvious focal weakness,gait within normal limits no abnormal reflexes or  asymmetrical SKIN: No acute rashes normal turgor, color, no bruising or petechiae. PSYCH: Oriented, good eye contact, no obvious depression anxiety, cognition and judgment appear normal. LN: no cervical axillary inguinal adenopathy  Lab Results  Component Value Date   WBC 4.9 03/13/2018   HGB 13.3 03/13/2018   HCT 40.5 03/13/2018   PLT 253.0 03/13/2018   GLUCOSE 92 03/13/2018   CHOL 187 03/13/2018   TRIG  128.0 03/13/2018   HDL 66.50 03/13/2018   LDLCALC 95 03/13/2018   ALT 9 03/13/2018   AST 11 03/13/2018   NA 137 03/13/2018   K 4.4 03/13/2018   CL 104 03/13/2018   CREATININE 1.03 03/13/2018   BUN 17 03/13/2018   CO2 25 03/13/2018   TSH 3.60 03/13/2018    BP Readings from Last 3 Encounters:  03/12/19 122/76  03/13/18 118/66  03/10/17 102/68    Lab plan reviewed with patient   ASSESSMENT AND PLAN:  Discussed the following assessment and plan:    ICD-10-CM   1. Visit for preventive health examination  Z00.00 CBC with Differential/Platelet    Basic metabolic panel    Lipid panel  2. History of anemia  Z86.2 CBC with Differential/Platelet    Basic metabolic panel    Lipid panel  3. Screening, lipid  Z13.220 CBC with Differential/Platelet    Basic metabolic panel    Lipid panel  4. Family history of diabetes mellitus  Z83.3 CBC with Differential/Platelet    Basic metabolic panel    Lipid panel  5. Oral contraceptive use  Z30.41    Check for anemia  bg and lipids  Is fasting today  Healthy  Continue ocps  Patient Care Team: Camree Wigington, Standley Brooking, MD as PCP - General Patient Instructions  Continue lifestyle intervention healthy eating and exercise .   Your exam is good today .   Health Maintenance, Female Adopting a healthy lifestyle and getting preventive care are important in promoting health and wellness. Ask your health care provider about:  The right schedule for you to have regular tests and exams.  Things you can do on your own to prevent diseases and  keep yourself healthy. What should I know about diet, weight, and exercise? Eat a healthy diet   Eat a diet that includes plenty of vegetables, fruits, low-fat dairy products, and lean protein.  Do not eat a lot of foods that are high in solid fats, added sugars, or sodium. Maintain a healthy weight Body mass index (BMI) is used to identify weight problems. It estimates body fat based on height and weight. Your health care provider can help determine your BMI and help you achieve or maintain a healthy weight. Get regular exercise Get regular exercise. This is one of the most important things you can do for your health. Most adults should:  Exercise for at least 150 minutes each week. The exercise should increase your heart rate and make you sweat (moderate-intensity exercise).  Do strengthening exercises at least twice a week. This is in addition to the moderate-intensity exercise.  Spend less time sitting. Even light physical activity can be beneficial. Watch cholesterol and blood lipids Have your blood tested for lipids and cholesterol at 23 years of age, then have this test every 5 years. Have your cholesterol levels checked more often if:  Your lipid or cholesterol levels are high.  You are older than 23 years of age.  You are at high risk for heart disease. What should I know about cancer screening? Depending on your health history and family history, you may need to have cancer screening at various ages. This may include screening for:  Breast cancer.  Cervical cancer.  Colorectal cancer.  Skin cancer.  Lung cancer. What should I know about heart disease, diabetes, and high blood pressure? Blood pressure and heart disease  High blood pressure causes heart disease and increases the risk of stroke. This is more likely to  develop in people who have high blood pressure readings, are of African descent, or are overweight.  Have your blood pressure checked: ? Every 3-5  years if you are 5-72 years of age. ? Every year if you are 36 years old or older. Diabetes Have regular diabetes screenings. This checks your fasting blood sugar level. Have the screening done:  Once every three years after age 94 if you are at a normal weight and have a low risk for diabetes.  More often and at a younger age if you are overweight or have a high risk for diabetes. What should I know about preventing infection? Hepatitis B If you have a higher risk for hepatitis B, you should be screened for this virus. Talk with your health care provider to find out if you are at risk for hepatitis B infection. Hepatitis C Testing is recommended for:  Everyone born from [birth year] through 1964.  Anyone with known risk factors for hepatitis C. Sexually transmitted infections (STIs)  Get screened for STIs, including gonorrhea and chlamydia, if: ? You are sexually active and are younger than 23 years of age. ? You are older than 23 years of age and your health care provider tells you that you are at risk for this type of infection. ? Your sexual activity has changed since you were last screened, and you are at increased risk for chlamydia or gonorrhea. Ask your health care provider if you are at risk.  Ask your health care provider about whether you are at high risk for HIV. Your health care provider may recommend a prescription medicine to help prevent HIV infection. If you choose to take medicine to prevent HIV, you should first get tested for HIV. You should then be tested every 3 months for as long as you are taking the medicine. Pregnancy  If you are about to stop having your period (premenopausal) and you may become pregnant, seek counseling before you get pregnant.  Take 400 to 800 micrograms (mcg) of folic acid every day if you become pregnant.  Ask for birth control (contraception) if you want to prevent pregnancy. Osteoporosis and menopause Osteoporosis is a disease in which the  bones lose minerals and strength with aging. This can result in bone fractures. If you are 79 years old or older, or if you are at risk for osteoporosis and fractures, ask your health care provider if you should:  Be screened for bone loss.  Take a calcium or vitamin D supplement to lower your risk of fractures.  Be given hormone replacement therapy (HRT) to treat symptoms of menopause. Follow these instructions at home: Lifestyle  Do not use any products that contain nicotine or tobacco, such as cigarettes, e-cigarettes, and chewing tobacco. If you need help quitting, ask your health care provider.  Do not use street drugs.  Do not share needles.  Ask your health care provider for help if you need support or information about quitting drugs. Alcohol use  Do not drink alcohol if: ? Your health care provider tells you not to drink. ? You are pregnant, may be pregnant, or are planning to become pregnant.  If you drink alcohol: ? Limit how much you use to 0-1 drink a day. ? Limit intake if you are breastfeeding.  Be aware of how much alcohol is in your drink. In the [place] one drink equals one 12 oz bottle of beer (355 mL), one 5 oz glass of wine (148 mL), or one 1 oz glass  of hard liquor (44 mL). General instructions  Schedule regular health, dental, and eye exams.  Stay current with your vaccines.  Tell your health care provider if: ? You often feel depressed. ? You have ever been abused or do not feel safe at home. Summary  Adopting a healthy lifestyle and getting preventive care are important in promoting health and wellness.  Follow your health care provider's instructions about healthy diet, exercising, and getting tested or screened for diseases.  Follow your health care provider's instructions on monitoring your cholesterol and blood pressure. This information is not intended to replace advice given to you by your health care provider. Make sure you discuss any  questions you have with your health care provider. Document Revised: 12/13/2017 Document Reviewed: 12/13/2017 Elsevier Patient Education  2020 [employer].    Napoleon K. Emmaline Wahba M.D.

## 2019-03-12 ENCOUNTER — Ambulatory Visit (INDEPENDENT_AMBULATORY_CARE_PROVIDER_SITE_OTHER): Payer: BC Managed Care – PPO | Admitting: Internal Medicine

## 2019-03-12 ENCOUNTER — Encounter: Payer: Self-pay | Admitting: Internal Medicine

## 2019-03-12 VITALS — BP 122/76 | HR 95 | Temp 97.8°F | Ht 68.75 in | Wt 158.0 lb

## 2019-03-12 DIAGNOSIS — Z833 Family history of diabetes mellitus: Secondary | ICD-10-CM | POA: Diagnosis not present

## 2019-03-12 DIAGNOSIS — Z13220 Encounter for screening for lipoid disorders: Secondary | ICD-10-CM | POA: Diagnosis not present

## 2019-03-12 DIAGNOSIS — Z862 Personal history of diseases of the blood and blood-forming organs and certain disorders involving the immune mechanism: Secondary | ICD-10-CM | POA: Diagnosis not present

## 2019-03-12 DIAGNOSIS — Z3041 Encounter for surveillance of contraceptive pills: Secondary | ICD-10-CM

## 2019-03-12 DIAGNOSIS — Z0000 Encounter for general adult medical examination without abnormal findings: Secondary | ICD-10-CM

## 2019-03-12 LAB — CBC WITH DIFFERENTIAL/PLATELET
Basophils Absolute: 0.1 10*3/uL (ref 0.0–0.1)
Basophils Relative: 2.2 % (ref 0.0–3.0)
Eosinophils Absolute: 0.2 10*3/uL (ref 0.0–0.7)
Eosinophils Relative: 4.7 % (ref 0.0–5.0)
HCT: 32.8 % — ABNORMAL LOW (ref 36.0–46.0)
Hemoglobin: 10.6 g/dL — ABNORMAL LOW (ref 12.0–15.0)
Lymphocytes Relative: 48 % — ABNORMAL HIGH (ref 12.0–46.0)
Lymphs Abs: 1.5 10*3/uL (ref 0.7–4.0)
MCHC: 32.3 g/dL (ref 30.0–36.0)
MCV: 75.6 fl — ABNORMAL LOW (ref 78.0–100.0)
Monocytes Absolute: 0.3 10*3/uL (ref 0.1–1.0)
Monocytes Relative: 9.1 % (ref 3.0–12.0)
Neutro Abs: 1.1 10*3/uL — ABNORMAL LOW (ref 1.4–7.7)
Neutrophils Relative %: 36 % — ABNORMAL LOW (ref 43.0–77.0)
Platelets: 264 10*3/uL (ref 150.0–400.0)
RBC: 4.34 Mil/uL (ref 3.87–5.11)
RDW: 15.5 % (ref 11.5–15.5)
WBC: 3.2 10*3/uL — ABNORMAL LOW (ref 4.0–10.5)

## 2019-03-12 LAB — BASIC METABOLIC PANEL
BUN: 15 mg/dL (ref 6–23)
CO2: 25 mEq/L (ref 19–32)
Calcium: 8.8 mg/dL (ref 8.4–10.5)
Chloride: 105 mEq/L (ref 96–112)
Creatinine, Ser: 0.97 mg/dL (ref 0.40–1.20)
GFR: 71.61 mL/min (ref >60.00)
Glucose, Bld: 98 mg/dL (ref 70–99)
Potassium: 4 mEq/L (ref 3.5–5.1)
Sodium: 136 mEq/L (ref 135–145)

## 2019-03-12 LAB — LIPID PANEL
Cholesterol: 183 mg/dL (ref 0–200)
HDL: 55.7 mg/dL (ref >39.00)
LDL Cholesterol: 106 mg/dL — ABNORMAL HIGH (ref 0–99)
NonHDL: 127.52
Total CHOL/HDL Ratio: 3
Triglycerides: 110 mg/dL (ref 0.0–149.0)
VLDL: 22 mg/dL (ref 0.0–40.0)

## 2019-03-12 NOTE — Progress Notes (Signed)
You do have anemia  still advise take iron every day or every other day and repeat  cbc   diff I ferritin ibc panel in 1 month ( no fasting needed)

## 2019-03-12 NOTE — Patient Instructions (Signed)
Continue lifestyle intervention healthy eating and exercise .   Your exam is good today .   Health Maintenance, Female Adopting a healthy lifestyle and getting preventive care are important in promoting health and wellness. Ask your health care provider about:  The right schedule for you to have regular tests and exams.  Things you can do on your own to prevent diseases and keep yourself healthy. What should I know about diet, weight, and exercise? Eat a healthy diet   Eat a diet that includes plenty of vegetables, fruits, low-fat dairy products, and lean protein.  Do not eat a lot of foods that are high in solid fats, added sugars, or sodium. Maintain a healthy weight Body mass index (BMI) is used to identify weight problems. It estimates body fat based on height and weight. Your health care provider can help determine your BMI and help you achieve or maintain a healthy weight. Get regular exercise Get regular exercise. This is one of the most important things you can do for your health. Most adults should:  Exercise for at least 150 minutes each week. The exercise should increase your heart rate and make you sweat (moderate-intensity exercise).  Do strengthening exercises at least twice a week. This is in addition to the moderate-intensity exercise.  Spend less time sitting. Even light physical activity can be beneficial. Watch cholesterol and blood lipids Have your blood tested for lipids and cholesterol at 23 years of age, then have this test every 5 years. Have your cholesterol levels checked more often if:  Your lipid or cholesterol levels are high.  You are older than 23 years of age.  You are at high risk for heart disease. What should I know about cancer screening? Depending on your health history and family history, you may need to have cancer screening at various ages. This may include screening for:  Breast cancer.  Cervical cancer.  Colorectal cancer.  Skin  cancer.  Lung cancer. What should I know about heart disease, diabetes, and high blood pressure? Blood pressure and heart disease  High blood pressure causes heart disease and increases the risk of stroke. This is more likely to develop in people who have high blood pressure readings, are of African descent, or are overweight.  Have your blood pressure checked: ? Every 3-5 years if you are 23-30 years of age. ? Every year if you are 23 years old or older. Diabetes Have regular diabetes screenings. This checks your fasting blood sugar level. Have the screening done:  Once every three years after age 36 if you are at a normal weight and have a low risk for diabetes.  More often and at a younger age if you are overweight or have a high risk for diabetes. What should I know about preventing infection? Hepatitis B If you have a higher risk for hepatitis B, you should be screened for this virus. Talk with your health care provider to find out if you are at risk for hepatitis B infection. Hepatitis C Testing is recommended for:  Everyone born from [birth year] through 1964.  Anyone with known risk factors for hepatitis C. Sexually transmitted infections (STIs)  Get screened for STIs, including gonorrhea and chlamydia, if: ? You are sexually active and are younger than 23 years of age. ? You are older than 23 years of age and your health care provider tells you that you are at risk for this type of infection. ? Your sexual activity has changed since you were  last screened, and you are at increased risk for chlamydia or gonorrhea. Ask your health care provider if you are at risk.  Ask your health care provider about whether you are at high risk for HIV. Your health care provider may recommend a prescription medicine to help prevent HIV infection. If you choose to take medicine to prevent HIV, you should first get tested for HIV. You should then be tested every 3 months for as long as you are taking  the medicine. Pregnancy  If you are about to stop having your period (premenopausal) and you may become pregnant, seek counseling before you get pregnant.  Take 400 to 800 micrograms (mcg) of folic acid every day if you become pregnant.  Ask for birth control (contraception) if you want to prevent pregnancy. Osteoporosis and menopause Osteoporosis is a disease in which the bones lose minerals and strength with aging. This can result in bone fractures. If you are 62 years old or older, or if you are at risk for osteoporosis and fractures, ask your health care provider if you should:  Be screened for bone loss.  Take a calcium or vitamin D supplement to lower your risk of fractures.  Be given hormone replacement therapy (HRT) to treat symptoms of menopause. Follow these instructions at home: Lifestyle  Do not use any products that contain nicotine or tobacco, such as cigarettes, e-cigarettes, and chewing tobacco. If you need help quitting, ask your health care provider.  Do not use street drugs.  Do not share needles.  Ask your health care provider for help if you need support or information about quitting drugs. Alcohol use  Do not drink alcohol if: ? Your health care provider tells you not to drink. ? You are pregnant, may be pregnant, or are planning to become pregnant.  If you drink alcohol: ? Limit how much you use to 0-1 drink a day. ? Limit intake if you are breastfeeding.  Be aware of how much alcohol is in your drink. In the [place] one drink equals one 12 oz bottle of beer (355 mL), one 5 oz glass of wine (148 mL), or one 1 oz glass of hard liquor (44 mL). General instructions  Schedule regular health, dental, and eye exams.  Stay current with your vaccines.  Tell your health care provider if: ? You often feel depressed. ? You have ever been abused or do not feel safe at home. Summary  Adopting a healthy lifestyle and getting preventive care are important in  promoting health and wellness.  Follow your health care provider's instructions about healthy diet, exercising, and getting tested or screened for diseases.  Follow your health care provider's instructions on monitoring your cholesterol and blood pressure. This information is not intended to replace advice given to you by your health care provider. Make sure you discuss any questions you have with your health care provider. Document Revised: 12/13/2017 Document Reviewed: 12/13/2017 Elsevier Patient Education  2020 [employer].

## 2019-03-13 ENCOUNTER — Other Ambulatory Visit: Payer: Self-pay

## 2019-03-13 DIAGNOSIS — D509 Iron deficiency anemia, unspecified: Secondary | ICD-10-CM

## 2019-03-14 ENCOUNTER — Encounter: Payer: BLUE CROSS/BLUE SHIELD | Admitting: Internal Medicine

## 2019-03-21 DIAGNOSIS — M545 Low back pain: Secondary | ICD-10-CM | POA: Diagnosis not present

## 2019-04-12 ENCOUNTER — Other Ambulatory Visit: Payer: Self-pay

## 2019-04-15 ENCOUNTER — Other Ambulatory Visit: Payer: Self-pay

## 2019-04-15 ENCOUNTER — Other Ambulatory Visit (INDEPENDENT_AMBULATORY_CARE_PROVIDER_SITE_OTHER): Payer: BC Managed Care – PPO

## 2019-04-15 DIAGNOSIS — D509 Iron deficiency anemia, unspecified: Secondary | ICD-10-CM

## 2019-04-15 LAB — CBC WITH DIFFERENTIAL/PLATELET
Basophils Absolute: 0 10*3/uL (ref 0.0–0.1)
Basophils Relative: 1.1 % (ref 0.0–3.0)
Eosinophils Absolute: 0.2 10*3/uL (ref 0.0–0.7)
Eosinophils Relative: 5 % (ref 0.0–5.0)
HCT: 35 % — ABNORMAL LOW (ref 36.0–46.0)
Hemoglobin: 11.1 g/dL — ABNORMAL LOW (ref 12.0–15.0)
Lymphocytes Relative: 40 % (ref 12.0–46.0)
Lymphs Abs: 1.7 10*3/uL (ref 0.7–4.0)
MCHC: 31.7 g/dL (ref 30.0–36.0)
MCV: 76.8 fl — ABNORMAL LOW (ref 78.0–100.0)
Monocytes Absolute: 0.3 10*3/uL (ref 0.1–1.0)
Monocytes Relative: 7.4 % (ref 3.0–12.0)
Neutro Abs: 1.9 10*3/uL (ref 1.4–7.7)
Neutrophils Relative %: 46.5 % (ref 43.0–77.0)
Platelets: 264 10*3/uL (ref 150.0–400.0)
RBC: 4.56 Mil/uL (ref 3.87–5.11)
RDW: 17.4 % — ABNORMAL HIGH (ref 11.5–15.5)
WBC: 4.1 10*3/uL (ref 4.0–10.5)

## 2019-04-15 LAB — IBC PANEL
Iron: 49 ug/dL (ref 42–145)
Saturation Ratios: 8.7 % — ABNORMAL LOW (ref 20.0–50.0)
Transferrin: 402 mg/dL — ABNORMAL HIGH (ref 212.0–360.0)

## 2019-04-15 LAB — FERRITIN: Ferritin: 3.2 ng/mL — ABNORMAL LOW (ref 10.0–291.0)

## 2019-04-17 ENCOUNTER — Other Ambulatory Visit: Payer: Self-pay

## 2019-04-17 NOTE — Progress Notes (Signed)
Hg anemia is improved but not yet normal   continue on iron supplement   Recheck cbc diff and ferritin in  3 months  ( even when back to normal may need to stay on iron for 3 mor months to get the iron storage  up to normal )

## 2019-04-19 ENCOUNTER — Other Ambulatory Visit: Payer: Self-pay

## 2019-04-19 DIAGNOSIS — D509 Iron deficiency anemia, unspecified: Secondary | ICD-10-CM

## 2019-05-06 ENCOUNTER — Other Ambulatory Visit: Payer: Self-pay | Admitting: Internal Medicine

## 2019-07-23 ENCOUNTER — Other Ambulatory Visit: Payer: BC Managed Care – PPO

## 2019-08-26 ENCOUNTER — Other Ambulatory Visit (INDEPENDENT_AMBULATORY_CARE_PROVIDER_SITE_OTHER): Payer: BC Managed Care – PPO

## 2019-08-26 ENCOUNTER — Other Ambulatory Visit: Payer: Self-pay

## 2019-08-26 DIAGNOSIS — D509 Iron deficiency anemia, unspecified: Secondary | ICD-10-CM | POA: Diagnosis not present

## 2019-08-27 ENCOUNTER — Other Ambulatory Visit: Payer: Self-pay

## 2019-08-27 DIAGNOSIS — R790 Abnormal level of blood mineral: Secondary | ICD-10-CM

## 2019-08-27 LAB — CBC WITH DIFFERENTIAL/PLATELET
Absolute Monocytes: 384 cells/uL (ref 200–950)
Basophils Absolute: 71 cells/uL (ref 0–200)
Basophils Relative: 1.2 %
Eosinophils Absolute: 112 cells/uL (ref 15–500)
Eosinophils Relative: 1.9 %
HCT: 38 % (ref 35.0–45.0)
Hemoglobin: 11.7 g/dL (ref 11.7–15.5)
Lymphs Abs: 1481 cells/uL (ref 850–3900)
MCH: 24.8 pg — ABNORMAL LOW (ref 27.0–33.0)
MCHC: 30.8 g/dL — ABNORMAL LOW (ref 32.0–36.0)
MCV: 80.7 fL (ref 80.0–100.0)
MPV: 10.4 fL (ref 7.5–12.5)
Monocytes Relative: 6.5 %
Neutro Abs: 3853 cells/uL (ref 1500–7800)
Neutrophils Relative %: 65.3 %
Platelets: 277 10*3/uL (ref 140–400)
RBC: 4.71 10*6/uL (ref 3.80–5.10)
RDW: 14.2 % (ref 11.0–15.0)
Total Lymphocyte: 25.1 %
WBC: 5.9 10*3/uL (ref 3.8–10.8)

## 2019-08-27 LAB — FERRITIN: Ferritin: 3 ng/mL — ABNORMAL LOW (ref 16–154)

## 2019-08-27 NOTE — Progress Notes (Signed)
Anemia improved but iron  stores still low    Continue on iron    for another 4 months     Cbcdiff and ferritin in 4 months

## 2019-09-05 DIAGNOSIS — Z6823 Body mass index (BMI) 23.0-23.9, adult: Secondary | ICD-10-CM | POA: Diagnosis not present

## 2019-09-05 DIAGNOSIS — Z01419 Encounter for gynecological examination (general) (routine) without abnormal findings: Secondary | ICD-10-CM | POA: Diagnosis not present

## 2019-12-11 DIAGNOSIS — J310 Chronic rhinitis: Secondary | ICD-10-CM | POA: Diagnosis not present

## 2019-12-11 DIAGNOSIS — J343 Hypertrophy of nasal turbinates: Secondary | ICD-10-CM | POA: Diagnosis not present

## 2019-12-12 ENCOUNTER — Other Ambulatory Visit: Payer: Self-pay | Admitting: Otolaryngology

## 2019-12-12 DIAGNOSIS — J329 Chronic sinusitis, unspecified: Secondary | ICD-10-CM

## 2019-12-19 ENCOUNTER — Ambulatory Visit
Admission: RE | Admit: 2019-12-19 | Discharge: 2019-12-19 | Disposition: A | Discharge status: Home / Self Care | Payer: Self-pay | Source: Ambulatory Visit | Attending: Otolaryngology | Admitting: Otolaryngology

## 2019-12-19 DIAGNOSIS — J329 Chronic sinusitis, unspecified: Secondary | ICD-10-CM

## 2019-12-19 DIAGNOSIS — J321 Chronic frontal sinusitis: Secondary | ICD-10-CM | POA: Diagnosis not present

## 2019-12-19 DIAGNOSIS — J3489 Other specified disorders of nose and nasal sinuses: Secondary | ICD-10-CM | POA: Diagnosis not present

## 2019-12-23 ENCOUNTER — Other Ambulatory Visit: Payer: Self-pay

## 2019-12-23 ENCOUNTER — Other Ambulatory Visit (INDEPENDENT_AMBULATORY_CARE_PROVIDER_SITE_OTHER): Payer: BC Managed Care – PPO

## 2019-12-23 DIAGNOSIS — R790 Abnormal level of blood mineral: Secondary | ICD-10-CM | POA: Diagnosis not present

## 2019-12-23 LAB — CBC WITH DIFFERENTIAL/PLATELET
Absolute Monocytes: 420 cells/uL (ref 200–950)
Basophils Absolute: 50 cells/uL (ref 0–200)
Basophils Relative: 1.2 %
Eosinophils Absolute: 130 cells/uL (ref 15–500)
Eosinophils Relative: 3.1 %
HCT: 35.4 % (ref 35.0–45.0)
Hemoglobin: 11 g/dL — ABNORMAL LOW (ref 11.7–15.5)
Lymphs Abs: 1823 cells/uL (ref 850–3900)
MCH: 25.6 pg — ABNORMAL LOW (ref 27.0–33.0)
MCHC: 31.1 g/dL — ABNORMAL LOW (ref 32.0–36.0)
MCV: 82.5 fL (ref 80.0–100.0)
MPV: 10.2 fL (ref 7.5–12.5)
Monocytes Relative: 10 %
Neutro Abs: 1777 cells/uL (ref 1500–7800)
Neutrophils Relative %: 42.3 %
Platelets: 315 10*3/uL (ref 140–400)
RBC: 4.29 10*6/uL (ref 3.80–5.10)
RDW: 13.5 % (ref 11.0–15.0)
Total Lymphocyte: 43.4 %
WBC: 4.2 10*3/uL (ref 3.8–10.8)

## 2019-12-23 LAB — FERRITIN: Ferritin: 2 ng/mL — ABNORMAL LOW (ref 16–154)

## 2019-12-24 ENCOUNTER — Other Ambulatory Visit: Payer: BC Managed Care – PPO

## 2020-01-05 NOTE — Progress Notes (Signed)
Still low iron  and mild anemia need update about  how  or if taking iron . Can do a virtual visit  if needed  ar you still donating blood?

## 2020-01-08 ENCOUNTER — Other Ambulatory Visit: Payer: Self-pay

## 2020-01-08 DIAGNOSIS — E611 Iron deficiency: Secondary | ICD-10-CM

## 2020-01-08 NOTE — Progress Notes (Signed)
So  see if can be more consistent with taking iron  please record which iron product she is taking ( avoid the enteric coated or sustained release because they can have poor absorption   cbcdiff  ferritin and celiac panel  in   6-8 weeks dx iron deficiency

## 2020-01-10 DIAGNOSIS — J310 Chronic rhinitis: Secondary | ICD-10-CM | POA: Diagnosis not present

## 2020-01-10 DIAGNOSIS — J343 Hypertrophy of nasal turbinates: Secondary | ICD-10-CM | POA: Diagnosis not present

## 2020-01-10 DIAGNOSIS — J342 Deviated nasal septum: Secondary | ICD-10-CM | POA: Diagnosis not present

## 2020-01-17 DIAGNOSIS — Z20828 Contact with and (suspected) exposure to other viral communicable diseases: Secondary | ICD-10-CM | POA: Diagnosis not present

## 2020-01-29 ENCOUNTER — Encounter: Payer: Self-pay | Admitting: Internal Medicine

## 2020-02-13 NOTE — Addendum Note (Signed)
Addended by: Lerry Liner on: 02/13/2020 02:59 PM   Modules accepted: Orders

## 2020-03-03 ENCOUNTER — Other Ambulatory Visit: Payer: Self-pay

## 2020-03-04 ENCOUNTER — Other Ambulatory Visit (INDEPENDENT_AMBULATORY_CARE_PROVIDER_SITE_OTHER): Payer: BC Managed Care – PPO

## 2020-03-04 DIAGNOSIS — E611 Iron deficiency: Secondary | ICD-10-CM | POA: Diagnosis not present

## 2020-03-04 NOTE — Addendum Note (Signed)
Addended by: Leonette Nutting on: 03/04/2020 08:54 AM   Modules accepted: Orders

## 2020-03-04 NOTE — Addendum Note (Signed)
Addended by: Baron Parmelee L on: 03/04/2020 08:54 AM   Modules accepted: Orders  

## 2020-03-05 LAB — CBC WITH DIFFERENTIAL/PLATELET
Absolute Monocytes: 360 cells/uL (ref 200–950)
Basophils Absolute: 40 cells/uL (ref 0–200)
Basophils Relative: 1 %
Eosinophils Absolute: 120 cells/uL (ref 15–500)
Eosinophils Relative: 3 %
HCT: 37.9 % (ref 35.0–45.0)
Hemoglobin: 12.2 g/dL (ref 11.7–15.5)
Lymphs Abs: 2028 cells/uL (ref 850–3900)
MCH: 27 pg (ref 27.0–33.0)
MCHC: 32.2 g/dL (ref 32.0–36.0)
MCV: 83.8 fL (ref 80.0–100.0)
MPV: 11 fL (ref 7.5–12.5)
Monocytes Relative: 9 %
Neutro Abs: 1452 cells/uL — ABNORMAL LOW (ref 1500–7800)
Neutrophils Relative %: 36.3 %
Platelets: 247 10*3/uL (ref 140–400)
RBC: 4.52 10*6/uL (ref 3.80–5.10)
RDW: 14.3 % (ref 11.0–15.0)
Total Lymphocyte: 50.7 %
WBC: 4 10*3/uL (ref 3.8–10.8)

## 2020-03-05 LAB — CBC AND DIFFERENTIAL
HCT: 33 — AB (ref 36–46)
Hemoglobin: 10.5 — AB (ref 12.0–16.0)
Platelets: 289 (ref 150–399)
WBC: 4.4

## 2020-03-05 LAB — FERRITIN: Ferritin: 3 ng/mL — ABNORMAL LOW (ref 16–154)

## 2020-03-05 LAB — CBC: RBC: 4.05 (ref 3.87–5.11)

## 2020-03-05 LAB — IRON,TIBC AND FERRITIN PANEL
Ferritin: 9
Iron: 53

## 2020-03-06 DIAGNOSIS — E611 Iron deficiency: Secondary | ICD-10-CM

## 2020-03-06 DIAGNOSIS — K900 Celiac disease: Secondary | ICD-10-CM

## 2020-03-06 LAB — CELIAC PANEL 10
Antigliadin Abs, IgA: 194 units — ABNORMAL HIGH (ref 0–19)
Endomysial IgA: POSITIVE — AB
Gliadin IgG: 103 units — ABNORMAL HIGH (ref 0–19)
IgA/Immunoglobulin A, Serum: 145 mg/dL (ref 87–352)
Tissue Transglut Ab: 16 U/mL — ABNORMAL HIGH (ref 0–5)
Transglutaminase IgA: >100 U/mL — ABNORMAL HIGH (ref 0–3)

## 2020-03-08 NOTE — Telephone Encounter (Signed)
Please see the result note comments  You may want to make a virtual visit  Sooner than the March 21 visit to discuss further.

## 2020-03-08 NOTE — Progress Notes (Signed)
So your anemia is better but iron still low .  The screening antibody  tests show you most likely have celiac  disease. That is causing your low iron absorption.   At this time I want you to go on a totally gluten free diet . we may have you see a GI  person   . You may be helped by seeing a dietician /nutrition specialist  Do you want [place] to do a referral?   Or wait and discuss... You may want to make a video visit this week   or sooner than   your visit planned March 21  to discuss     FYI most people do well if  on strict  gluten free eating.

## 2020-03-11 ENCOUNTER — Ambulatory Visit (INDEPENDENT_AMBULATORY_CARE_PROVIDER_SITE_OTHER): Payer: BC Managed Care – PPO | Admitting: Internal Medicine

## 2020-03-11 ENCOUNTER — Encounter: Payer: Self-pay | Admitting: Internal Medicine

## 2020-03-11 ENCOUNTER — Other Ambulatory Visit: Payer: Self-pay

## 2020-03-11 VITALS — BP 100/62 | HR 81 | Temp 98.2°F | Resp 14 | Ht 68.25 in | Wt 164.0 lb

## 2020-03-11 DIAGNOSIS — Z0000 Encounter for general adult medical examination without abnormal findings: Secondary | ICD-10-CM

## 2020-03-11 DIAGNOSIS — R011 Cardiac murmur, unspecified: Secondary | ICD-10-CM | POA: Diagnosis not present

## 2020-03-11 DIAGNOSIS — D509 Iron deficiency anemia, unspecified: Secondary | ICD-10-CM | POA: Diagnosis not present

## 2020-03-11 DIAGNOSIS — K900 Celiac disease: Secondary | ICD-10-CM

## 2020-03-11 NOTE — Patient Instructions (Signed)
Every other day is ok for the iron.   Will send info to dr Lavon Paganini also .   At next blood check would arrange thryoid tsh free t4  And CMp ( chemistry) in addition to iron level    I advise in May or so . We can get lab here  But if Dr Dorris Carnes wants lab can be ordered by her  Team.     Gluten-Free Diet for Celiac Disease, Adult  The gluten-free diet includes all foods that do not contain gluten. Gluten is a protein that is found in wheat, rye, barley, and some other grains. Following the gluten-free diet is the only treatment for people with celiac disease. It helps to prevent damage to the intestines and improves or eliminates the symptoms of celiac disease. Following the gluten-free diet requires some planning. It can be challenging at first, but it gets easier with time and practice. There are more gluten-free options available today than ever before. If you need help finding gluten-free foods or if you have questions, talk with your dietitian or your health care provider. What are tips for following this plan? Reading food labels Read all food labels. Gluten is often added to foods. Always check the ingredient list and look for warnings, such as "may contain gluten." Foods that list any of these key words on the label usually contain gluten:  Wheat, flour, enriched flour, bromated flour, white flour, durum flour, graham flour, phosphated flour, self-rising flour, semolina, farina, barley (malt), rye, and oats.  Starch, dextrin, modified food starch, or cereal.  Thickening, fillers, or emulsifiers.  Malt flavoring, malt extract, or malt syrup.  Hydrolyzed vegetable protein. In the [place] packaged foods that are gluten-free are required to be labeled "GF." These foods should be easy to identify and are safe to eat. In the [place] food companies are also required to list common food allergens, including wheat, on their labels. Shopping When grocery shopping, start by shopping in the produce,  meat, and dairy sections. These sections are more likely to contain gluten-free foods. Then move to the aisles that contain packaged foods if you need to. Meal planning  All fruits, vegetables, and meats are safe to eat and do not contain gluten.  Talk with your dietitian or health care provider before taking a gluten-free multivitamin or mineral supplement.  Be aware of gluten-free foods having contact with foods that contain gluten (cross-contamination). This can happen at home and with any processed foods. ? Talk with your health care provider or dietitian about how to reduce the risk of cross-contamination in your home. ? If you have questions about how a food is processed, ask the manufacturer. What foods can I eat? Fruits All plain fresh, frozen, canned, and dried fruits, and 100% fruit juices. Vegetables All plain fresh, frozen, and canned vegetables. Grains Amaranth, bean flours, 100% buckwheat flour, corn, millet, nut flours or nut meals, GF oats, quinoa, rice, sorghum, teff, rice wafers, pure cornmeal tortillas, popcorn, and hot cereals made from cornmeal.  Hominy, rice, wild rice. Some Asian rice noodles or bean noodles.  Arrowroot starch, corn bran, corn flour, corn germ, cornmeal, corn starch, potato flour, potato starch flour, and rice bran. Plain, brown, and sweet rice flours. Rice polish, soy flour, and tapioca starch. Meats and other protein foods All fresh beef, pork, poultry, fish, seafood, and eggs. Fish canned in water, oil, brine, or vegetable broth. Plain nuts and seeds, peanut butter.  Some precooked or cured meat, such as sausages or  meat loaves. Some frankfurters. Dried beans, dried peas, and lentils. Dairy Fresh plain, dry, evaporated, or condensed milk. Cream, butter, sour cream, whipping cream, and most yogurts. Unprocessed cheese, most processed cheeses, some cottage cheeses, some cream cheeses. Beverages Coffee, tea, most herbal teas. Carbonated beverages and  some root beers. Wine, sake, and distilled spirits, such as gin, vodka, and whiskey. Most hard ciders. Fats and oils Butter, margarine, vegetable oil, hydrogenated butter, olive oil, shortening, lard, cream, and some mayonnaise. Some commercial salad dressings. Olives. Sweets and desserts Sugar, honey, some syrups, molasses, jelly, and jam. Plain hard candy, marshmallows, and gumdrops.  Pure cocoa powder. Plain chocolate. Custard and some pudding mixes. Gelatin desserts, sorbets, frozen ice pops, and sherbet.  Cake, cookies, and other desserts prepared with allowed flours. Some commercial ice creams. Cornstarch, tapioca, and rice puddings. Seasoning and other foods Some canned or frozen soups. Monosodium glutamate (MSG). Cider, rice, and wine vinegar. Baking soda and baking powder.  Cream of tartar. Baking and nutritional yeast. Certain soy sauces made without wheat (ask your dietitian about specific brands that are allowed).  Nuts, coconut, and chocolate. Salt, pepper, herbs, spices, flavoring extracts, imitation or artificial flavorings, natural flavorings, and food colorings.  Some medicines and supplements. Rice syrups. The items listed above may not be a complete list of foods and beverages you can eat. Contact a dietitian for more information. What foods should I avoid? Fruits Thickened or prepared fruits and some pie fillings. Some fruit snacks and fruit roll-ups. Vegetables Most creamed vegetables and most vegetables canned in sauces. Some commercially prepared vegetables and salads. Vegetables in a soy sauce marinade or dressing. Grains Barley, bran, bulgur, couscous, cracked wheat, [place], farro, graham, malt, matzo, semolina, wheat germ, and all wheat and rye cereals including spelt and kamut.  Cereals containing malt as a flavoring, such as rice cereal. Noodles, spaghetti, macaroni, most packaged rice mixes, and all mixes containing wheat, rye, barley, or triticale. Meats and other  protein foods Any meat or meat alternative containing wheat, rye, barley, or gluten stabilizers. These are often marinated or packaged meats, and precooked or cured meat, such as sausages or meat loaves. Bread-containing products, such as Swiss steak, croquettes, meatballs, and meatloaf. Most tuna canned in vegetable broth and [place] with hydrolyzed vegetable protein (HVP) injected as part of the basting. [place]. Imitation fish. Eggs in sauces made from ingredients to avoid. Dairy Commercial chocolate milk drinks and malted milk. Some non-dairy creamers. Any cheese product containing ingredients to avoid. Beverages Certain cereal beverages. Beer, ale, malted milk, and some root beers. Some hard ciders. Some instant flavored coffees. Some herbal teas made with barley or with barley malt added. Fats and oils Some commercial salad dressings. Sour cream containing modified food starch. Sweets and desserts Some toffees. Chocolate-coated nuts (may be rolled in wheat flour) and some commercial candies and candy bars.  Most cakes, cookies, donuts, pastries, and other baked goods. Some commercial ice cream. Ice cream cones.  Commercially prepared mixes for cakes, cookies, and other desserts. Bread pudding and other puddings thickened with flour.  Products containing brown rice syrup made with barley malt enzyme. Desserts and sweets made with malt flavoring. Seasoning and other foods Some curry powders, some dry seasoning mixes, some gravy extracts, some meat sauces, some ketchups, some prepared mustards, and horseradish.  Certain soy sauces. Malt vinegar. Bouillon and bouillon cubes that contain HVP. Some chip dips, and some chewing gum.  Yeast extract. Brewer's yeast. Caramel color.  Some medicines and supplements. The  items listed above may not be a complete list of foods and beverages you should avoid. Contact a dietitian for more information. Summary  Gluten is a protein that is found in wheat,  rye, barley, and some other grains. The gluten-free diet includes all foods that do not contain gluten.  If you need help finding gluten-free foods or if you have questions, talk with your dietitian or your health care provider.  Read all food labels. Gluten is often added to foods. Always check the ingredient list and look for warnings, such as "may contain gluten." This information is not intended to replace advice given to you by your health care provider. Make sure you discuss any questions you have with your health care provider. Document Revised: 12/24/2018 Document Reviewed: 12/24/2018 Elsevier Patient Education  2021 [employer].

## 2020-03-11 NOTE — Progress Notes (Signed)
Chief Complaint  Patient presents with  . Annual Exam    HPI: Patient  Teresa [name]  25 y.o. comes in today for Preventive Health Care visit and also discuss her lab test done recently that showed positive antibodies and multiple aspects for her celiac panel.  She has been taking iron every day but did not do well with her GI tract causing gassy more frequent stools that were green at times So she switched to every other day and things are better. On detailed questioning she really has not had any ongoing GI disturbances over her life unusual rashes bloating food intolerances.  He is able to exercise without significant physical difficulty. Periods   On ocps  Reg .  Up-to-date on Pap sees GYN.  Health Maintenance  Topic Date Due  . Hepatitis C Screening  Never done  . PAP-Cervical Cytology Screening  Never done  . INFLUENZA VACCINE  04/02/2020 (Originally 08/04/2019)  . PAP SMEAR-Modifier  07/09/2021  . TETANUS/TDAP  03/12/2028  . HPV VACCINES  Completed  . COVID-19 Vaccine  Completed  . HIV Screening  Completed   Health Maintenance Review LIFESTYLE:  Exercise:   gyme  2 x per week.   ocass back pain.  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 June 2020  Had pos aby  covid   ROS:  GEN/ HEENT: No fever, significant weight changes sweats headaches vision problems hearing changes, CV/ PULM; No chest pain shortness of breath cough, syncope,edema  change in exercise tolerance. GI /GU: No adominal pain, vomiting, change in bowel habits. No blood in the stool. No significant GU symptoms. SKIN/HEME: ,no acute skin rashes suspicious lesions or bleeding. No lymphadenopathy, nodules, masses.  NEURO/ PSYCH:  No neurologic signs such as weakness numbness. No  depression anxiety. IMM/ Allergy: No unusual infections.  Allergy .   REST of 12 system review negative except as per HPI   Past Medical History:  Diagnosis Date  . Anemia   . 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 ARTICULATION DISORDER 08/03/2009   Qualifier: History of  By: Fabian Sharp MD, Neta Mends   . Dysmenorrhea in adolescent 08/23/2013   disc  nsaids preferred and fu if needed   . 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 No pertinent surgical history.  Family History  Problem Relation Age of Onset  . ADD / ADHD Mother   . Diabetes Mother        type 2   . Anemia Mother   . ADD / ADHD Sister   . Hyperlipidemia Other   . Depression Other   . Hypertension Other   . Heart disease Paternal Uncle   . Heart attack Maternal Grandfather   . Heart attack Paternal Grandfather   Mom has history of IBS and GI malignancy does not believe she was ever tested for celiac.  No other family history noted  Social History   Socioeconomic History  . Marital status: Single    Spouse name: Not on file  . Number of children: Not on file  . Years of education: Not on file  . Highest education level: Not on file  Occupational History  . Not  on file  Tobacco Use  . Smoking status: Never Smoker  . Smokeless tobacco: Never Used  Vaping Use  . Vaping Use: Never used  Substance and Sexual Activity  . Alcohol use: Yes    Alcohol/week: 0.0 standard drinks    Comment: rarely   . Drug use: No  . Sexual activity: Never  Other Topics Concern  . Not on file  Social History Narrative   [employer]  t [employer].    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 JUNEL FE 1.5/30 1.5-30 MG-MCG tablet TAKE 1 TABLET BY MOUTH EVERY DAY 84 tablet 1   No facility-administered medications prior to visit.     EXAM:  BP 100/62   Pulse 81   Temp 98.2 F (36.8 C) (Oral)   Resp 14   Ht 5' 8.25" (1.734 m)   Wt 164 lb (74.4 kg)   SpO2 99%   BMI 24.75 kg/m   Body mass index is 24.75 kg/m. Wt Readings from Last 3 Encounters:  03/11/20 164 lb (74.4 kg)  03/12/19 158 lb (71.7 kg)  03/13/18 155 lb 11.2 oz (70.6 kg)    Physical Exam: Vital signs reviewed UMP:NTIR is a well-developed well-nourished alert cooperative    who appearsr stated age in no acute distress.  HEENT: normocephalic atraumatic , Eyes: PERRL EOM's full, conjunctiva clear, Nares: paten,t no deformity discharge or tenderness., Ears: no deformity EAC's clear TMs with normal landmarks. Mouth: Masked NECK: supple without masses, thyromegaly or bruits. CHEST/PULM:  Clear to auscultation and percussion breath sounds equal no wheeze , rales or rhonchi. No chest wall deformities or tenderness. Breast: normal by inspection . No dimpling, discharge, masses, tenderness or discharge . CV: PMI is nondisplaced, S1 S2 no gallops, murmurs heard today, rubs. Peripheral pulses are full without delay.No JVD .  ABDOMEN: Bowel sounds normal nontender  No guard or rebound, no hepato splenomegal no CVA tenderness.  No hernia. Extremtities:  No clubbing cyanosis or edema, no acute joint swelling or redness no focal atrophy NEURO:  Oriented x3, cranial nerves 3-12 appear to be intact, no obvious focal weakness,gait within normal limits no abnormal reflexes or asymmetrical SKIN: No acute rashes normal turgor, color, no bruising or petechiae. PSYCH: Oriented, good eye contact, no obvious depression anxiety,  cognition and judgment appear normal. LN: no cervical axillary inguinal adenopathy  Lab Results  Component Value Date   WBC 4.0 03/04/2020   HGB 12.2 03/04/2020   HCT 37.9 03/04/2020   PLT 247 03/04/2020   GLUCOSE 98 03/12/2019   CHOL 183 03/12/2019   TRIG 110.0 03/12/2019   HDL 55.70 03/12/2019   LDLCALC 106 (H) 03/12/2019   ALT 9 03/13/2018   AST 11 03/13/2018   NA 136 03/12/2019   K 4.0 03/12/2019   CL 105 03/12/2019   CREATININE 0.97 03/12/2019  BUN 15 03/12/2019   CO2 25 03/12/2019   TSH 3.60 03/13/2018    BP Readings from Last 3 Encounters:  03/11/20 100/62  03/12/19 122/76  03/13/18 118/66    Lab results reviewed with patient  Pos all aby on celiac panel   ASSESSMENT AND PLAN:  Discussed the following assessment and plan:    ICD-10-CM   1. Visit for preventive health examination  Z00.00 TSH    T4, free    Comprehensive metabolic panel    CBC with Differential/Platelet    IBC + Ferritin  2. Iron deficiency anemia, unspecified iron deficiency anemia type  D50.9 TSH    T4, free    Comprehensive metabolic panel    CBC with Differential/Platelet    IBC + Ferritin  3. Celiac disease most likely  K90.0 TSH    T4, free    Comprehensive metabolic panel    CBC with Differential/Platelet    IBC + Ferritin  4. Heart murmur  R01.1 TSH    T4, free    Comprehensive metabolic panel    CBC with Differential/Platelet    IBC + Ferritin    ECHOCARDIOGRAM COMPLETE   Not heard today echo done 3 years ago trivial MVR plan echo before June when she goes to school  Can plan future CMP TSH free T4 iron levels and if few months may can be done here or as per Dr. Lavon Paganini  At this point she will attempt to go gluten-free we did discuss the challenges of that and at some point should see a dietitian.  This is assuming her diagnosis is confirmed.  Discussed how diagnosis is made with small by biopsy if appropriate usually on a gluten diet.  It is fortunate that she has  basically no symptoms except resistant mild iron deficiency anemia.  If this is celiac  No follow-ups on file.  Patient Care Team: Elveta Rape, Neta Mends, MD as PCP - Freddi Che, DO as Consulting Physician (Obstetrics and Gynecology) Patient Instructions  Every other day is ok for the iron.   Will send info to dr Lavon Paganini also .   At next blood check would arrange thryoid tsh free t4  And CMp ( chemistry) in addition to iron level    I advise in May or so . We can get lab here  But if Dr Dorris Carnes wants lab can be ordered by her  Team.     Gluten-Free Diet for Celiac Disease, Adult  The gluten-free diet includes all foods that do not contain gluten. Gluten is a protein that is found in wheat, rye, barley, and some other grains. Following the gluten-free diet is the only treatment for people with celiac disease. It helps to prevent damage to the intestines and improves or eliminates the symptoms of celiac disease. Following the gluten-free diet requires some planning. It can be challenging at first, but it gets easier with time and practice. There are more gluten-free options available today than ever before. If you need help finding gluten-free foods or if you have questions, talk with your dietitian or your health care provider. What are tips for following this plan? Reading food labels Read all food labels. Gluten is often added to foods. Always check the ingredient list and look for warnings, such as "may contain gluten." Foods that list any of these key words on the label usually contain gluten:  Wheat, flour, enriched flour, bromated flour, white flour, durum flour, graham flour, phosphated flour, self-rising flour, semolina, farina,  barley (malt), rye, and oats.  Starch, dextrin, modified food starch, or cereal.  Thickening, fillers, or emulsifiers.  Malt flavoring, malt extract, or malt syrup.  Hydrolyzed vegetable protein. In the [place] packaged foods that are gluten-free are  required to be labeled "GF." These foods should be easy to identify and are safe to eat. In the [place] food companies are also required to list common food allergens, including wheat, on their labels. Shopping When grocery shopping, start by shopping in the produce, meat, and dairy sections. These sections are more likely to contain gluten-free foods. Then move to the aisles that contain packaged foods if you need to. Meal planning  All fruits, vegetables, and meats are safe to eat and do not contain gluten.  Talk with your dietitian or health care provider before taking a gluten-free multivitamin or mineral supplement.  Be aware of gluten-free foods having contact with foods that contain gluten (cross-contamination). This can happen at home and with any processed foods. ? Talk with your health care provider or dietitian about how to reduce the risk of cross-contamination in your home. ? If you have questions about how a food is processed, ask the manufacturer. What foods can I eat? Fruits All plain fresh, frozen, canned, and dried fruits, and 100% fruit juices. Vegetables All plain fresh, frozen, and canned vegetables. Grains Amaranth, bean flours, 100% buckwheat flour, corn, millet, nut flours or nut meals, GF oats, quinoa, rice, sorghum, teff, rice wafers, pure cornmeal tortillas, popcorn, and hot cereals made from cornmeal.  Hominy, rice, wild rice. Some Asian rice noodles or bean noodles.  Arrowroot starch, corn bran, corn flour, corn germ, cornmeal, corn starch, potato flour, potato starch flour, and rice bran. Plain, brown, and sweet rice flours. Rice polish, soy flour, and tapioca starch. Meats and other protein foods All fresh beef, pork, poultry, fish, seafood, and eggs. Fish canned in water, oil, brine, or vegetable broth. Plain nuts and seeds, peanut butter.  Some precooked or cured meat, such as sausages or meat loaves. Some frankfurters. Dried beans, dried peas, and  lentils. Dairy Fresh plain, dry, evaporated, or condensed milk. Cream, butter, sour cream, whipping cream, and most yogurts. Unprocessed cheese, most processed cheeses, some cottage cheeses, some cream cheeses. Beverages Coffee, tea, most herbal teas. Carbonated beverages and some root beers. Wine, sake, and distilled spirits, such as gin, vodka, and whiskey. Most hard ciders. Fats and oils Butter, margarine, vegetable oil, hydrogenated butter, olive oil, shortening, lard, cream, and some mayonnaise. Some commercial salad dressings. Olives. Sweets and desserts Sugar, honey, some syrups, molasses, jelly, and jam. Plain hard candy, marshmallows, and gumdrops.  Pure cocoa powder. Plain chocolate. Custard and some pudding mixes. Gelatin desserts, sorbets, frozen ice pops, and sherbet.  Cake, cookies, and other desserts prepared with allowed flours. Some commercial ice creams. Cornstarch, tapioca, and rice puddings. Seasoning and other foods Some canned or frozen soups. Monosodium glutamate (MSG). Cider, rice, and wine vinegar. Baking soda and baking powder.  Cream of tartar. Baking and nutritional yeast. Certain soy sauces made without wheat (ask your dietitian about specific brands that are allowed).  Nuts, coconut, and chocolate. Salt, pepper, herbs, spices, flavoring extracts, imitation or artificial flavorings, natural flavorings, and food colorings.  Some medicines and supplements. Rice syrups. The items listed above may not be a complete list of foods and beverages you can eat. Contact a dietitian for more information. What foods should I avoid? Fruits Thickened or prepared fruits and some pie fillings. Some fruit snacks and  fruit roll-ups. Vegetables Most creamed vegetables and most vegetables canned in sauces. Some commercially prepared vegetables and salads. Vegetables in a soy sauce marinade or dressing. Grains Barley, bran, bulgur, couscous, cracked wheat, [place], farro, graham, malt,  matzo, semolina, wheat germ, and all wheat and rye cereals including spelt and kamut.  Cereals containing malt as a flavoring, such as rice cereal. Noodles, spaghetti, macaroni, most packaged rice mixes, and all mixes containing wheat, rye, barley, or triticale. Meats and other protein foods Any meat or meat alternative containing wheat, rye, barley, or gluten stabilizers. These are often marinated or packaged meats, and precooked or cured meat, such as sausages or meat loaves. Bread-containing products, such as Swiss steak, croquettes, meatballs, and meatloaf. Most tuna canned in vegetable broth and [place] with hydrolyzed vegetable protein (HVP) injected as part of the basting. Seitan. Imitation fish. Eggs in sauces made from ingredients to avoid. Dairy Commercial chocolate milk drinks and malted milk. Some non-dairy creamers. Any cheese product containing ingredients to avoid. Beverages Certain cereal beverages. Beer, ale, malted milk, and some root beers. Some hard ciders. Some instant flavored coffees. Some herbal teas made with barley or with barley malt added. Fats and oils Some commercial salad dressings. Sour cream containing modified food starch. Sweets and desserts Some toffees. Chocolate-coated nuts (may be rolled in wheat flour) and some commercial candies and candy bars.  Most cakes, cookies, donuts, pastries, and other baked goods. Some commercial ice cream. Ice cream cones.  Commercially prepared mixes for cakes, cookies, and other desserts. Bread pudding and other puddings thickened with flour.  Products containing brown rice syrup made with barley malt enzyme. Desserts and sweets made with malt flavoring. Seasoning and other foods Some curry powders, some dry seasoning mixes, some gravy extracts, some meat sauces, some ketchups, some prepared mustards, and horseradish.  Certain soy sauces. Malt vinegar. Bouillon and bouillon cubes that contain HVP. Some chip dips, and some chewing  gum.  Yeast extract. Brewer's yeast. Caramel color.  Some medicines and supplements. The items listed above may not be a complete list of foods and beverages you should avoid. Contact a dietitian for more information. Summary  Gluten is a protein that is found in wheat, rye, barley, and some other grains. The gluten-free diet includes all foods that do not contain gluten.  If you need help finding gluten-free foods or if you have questions, talk with your dietitian or your health care provider.  Read all food labels. Gluten is often added to foods. Always check the ingredient list and look for warnings, such as "may contain gluten." This information is not intended to replace advice given to you by your health care provider. Make sure you discuss any questions you have with your health care provider. Document Revised: 12/24/2018 Document Reviewed: 12/24/2018 Elsevier Patient Education  2021 [employer].        [place]. Patrena Santalucia M.D.

## 2020-03-13 ENCOUNTER — Encounter: Payer: BC Managed Care – PPO | Admitting: Internal Medicine

## 2020-03-23 ENCOUNTER — Encounter: Payer: BC Managed Care – PPO | Admitting: Internal Medicine

## 2020-04-01 ENCOUNTER — Telehealth: Payer: Self-pay

## 2020-04-01 NOTE — Telephone Encounter (Signed)
-----   Message from Madelin Headings, MD sent at 03/31/2020  4:52 PM EDT ----- Please tell patient that Dr. Lavon Paganini said to go ahead and go gluten-free  Dr Dorris Carnes can interpret any future testing based on when you went gluten free.

## 2020-04-01 NOTE — Telephone Encounter (Signed)
Patient informed of the message below.

## 2020-04-27 DIAGNOSIS — Z01818 Encounter for other preprocedural examination: Secondary | ICD-10-CM | POA: Diagnosis not present

## 2020-04-30 DIAGNOSIS — J3489 Other specified disorders of nose and nasal sinuses: Secondary | ICD-10-CM | POA: Diagnosis not present

## 2020-04-30 DIAGNOSIS — J342 Deviated nasal septum: Secondary | ICD-10-CM | POA: Diagnosis not present

## 2020-04-30 DIAGNOSIS — J343 Hypertrophy of nasal turbinates: Secondary | ICD-10-CM | POA: Diagnosis not present

## 2020-04-30 DIAGNOSIS — M950 Acquired deformity of nose: Secondary | ICD-10-CM | POA: Diagnosis not present

## 2020-05-06 ENCOUNTER — Encounter: Payer: Self-pay | Admitting: Gastroenterology

## 2020-05-06 ENCOUNTER — Ambulatory Visit: Payer: BC Managed Care – PPO | Admitting: Gastroenterology

## 2020-05-06 VITALS — BP 130/70 | HR 80 | Ht 68.0 in | Wt 162.0 lb

## 2020-05-06 DIAGNOSIS — K909 Intestinal malabsorption, unspecified: Secondary | ICD-10-CM

## 2020-05-06 NOTE — Progress Notes (Signed)
Teresa [name]    [id number]    [birth year]  Primary Care Physician:Panosh, Neta Mends, MD  Referring Physician: Madelin Headings, MD [address] [place],  [place] [address]   Chief complaint:  Celiac disease  HPI:  25 year old very pleasant female here for new patient visit accompanied by her mother.  She was recently diagnosed with celiac disease based on serology. She was having iron deficiency anemia was persistent despite taking oral iron daily.  Antigliadin Abs, IgA 0 - 19 units 194High     Gliadin IgG 0 - 19 units 103High     Transglutaminase IgA 0 - 3 U/mL >100High     Tissue Transglut Ab 0 - 5 U/mL 16High     Endomysial IgA Negative PositiveAbnormal   IgA/Immunoglobulin A, Serum 87 - 352 mg/dL 017     Denies any abdominal pain, diarrhea, skin rash or joint pain.  She does not have any specific GI symptoms. No family history of celiac disease She is following gluten-free diet, has not noticed any difference.  She recently underwent rhinoplasty and nasal septal repair 2 weeks ago  Outpatient Encounter Medications as of 05/06/2020  Medication Sig  . ferrous sulfate 325 (65 FE) MG EC tablet Take 325 mg by mouth every other day.  Colleen Can FE 1.5/30 1.5-30 MG-MCG tablet TAKE 1 TABLET BY MOUTH EVERY DAY   No facility-administered encounter medications on file as of 05/06/2020.    Allergies as of 05/06/2020  . (No Known Allergies)    Past Medical History:  Diagnosis Date  . Anemia   . Articulation delay    age 29 therapy  . ARTICULATION DISORDER 08/03/2009   Qualifier: History of  By: Fabian Sharp MD, Neta Mends   . Celiac disease   . Dysmenorrhea in adolescent 08/23/2013   disc  nsaids preferred and fu if needed   . Functional murmur    eval at early age    Past Surgical History:  Procedure Laterality Date  . Rhino plasty      Family History  Problem Relation Age of Onset  . ADD / ADHD Mother   . Diabetes Mother        type 2   .  Anemia Mother   . Colon polyps Mother   . Irritable bowel syndrome Mother   . ADD / ADHD Sister   . Hyperlipidemia Other   . Depression Other   . Hypertension Other   . Heart disease Paternal Uncle   . Heart attack Maternal Grandfather   . Heart attack Paternal Grandfather     Social History   Socioeconomic History  . Marital status: Single    Spouse name: Not on file  . Number of children: Not on file  . Years of education: Not on file  . Highest education level: Not on file  Occupational History  . Not on file  Tobacco Use  . Smoking status: Never Smoker  . Smokeless tobacco: Never Used  Vaping Use  . Vaping Use: Never used  Substance and Sexual Activity  . Alcohol use: Yes    Alcohol/week: 0.0 standard drinks    Comment: rarely   . Drug use: No  . Sexual activity: Never  Other Topics Concern  . Not on file  Social History Narrative   [employer]  t [employer].    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   Physical Exam: Vitals:   05/06/20 1426  BP: 130/70  Pulse: 80   Body mass index is 24.63 kg/m. Gen:      No acute distress HEENT:  sclera anicteric Abd:      soft, non-tender; no palpable masses, no distension Ext:    No edema Neuro: alert and oriented x 3 Psych: normal mood and affect  Data Reviewed:  Reviewed labs, radiology imaging, old records and pertinent past GI work  up   Assessment and Plan/Recommendations:  25 year old very pleasant female with iron deficiency anemia with recent diagnosis of celiac disease Continue strict gluten-free diet We will schedule for EGD to obtain duodenal biopsies to evaluate mucosa  Will obtain clearance from ENT prior to procedure given recent rhinoplasty  We will recheck TTG IgA antibody and CMP in June for surveillance  Discussed checking other family members for possible celiac disease.  Return in 6 months or sooner if needed  The risks and benefits as well as alternatives of endoscopic procedure(s) have been discussed and reviewed. All questions answered. The patient agrees to proceed.   The patient was provided an opportunity to ask questions and all were answered. The patient agreed with the plan and demonstrated an understanding of the instructions.  Iona Beard , MD    CC: Panosh, Neta Mends, MD

## 2020-05-06 NOTE — Patient Instructions (Addendum)
You have been scheduled for an endoscopy. Please follow written instructions given to you at your visit today. If you use inhalers (even only as needed), please bring them with you on the day of your procedure.   Have your labs drawn at   [facility] in June, Orders are in    Gluten-Free Diet for Celiac Disease, Adult  The gluten-free diet includes all foods that do not contain gluten. Gluten is a protein that is found in wheat, rye, barley, and some other grains. Following the gluten-free diet is the only treatment for people with celiac disease. It helps to prevent damage to the intestines and improves or eliminates the symptoms of celiac disease. Following the gluten-free diet requires some planning. It can be challenging at first, but it gets easier with time and practice. There are more gluten-free options available today than ever before. If you need help finding gluten-free foods or if you have questions, talk with your dietitian or your health care provider. What are tips for following this plan? Reading food labels Read all food labels. Gluten is often added to foods. Always check the ingredient list and look for warnings, such as "may contain gluten." Foods that list any of these key words on the label usually contain gluten:  Wheat, flour, enriched flour, bromated flour, white flour, durum flour, graham flour, phosphated flour, self-rising flour, semolina, farina, barley (malt), rye, and oats.  Starch, dextrin, modified food starch, or cereal.  Thickening, fillers, or emulsifiers.  Malt flavoring, malt extract, or malt syrup.  Hydrolyzed vegetable protein. In the [place] packaged foods that are gluten-free are required to be labeled "GF." These foods should be easy to identify and are safe to eat. In the [place] food companies are also required to list common food allergens, including wheat, on their labels. Shopping When grocery shopping, start by shopping in the produce,  meat, and dairy sections. These sections are more likely to contain gluten-free foods. Then move to the aisles that contain packaged foods if you need to. Meal planning  All fruits, vegetables, and meats are safe to eat and do not contain gluten.  Talk with your dietitian or health care provider before taking a gluten-free multivitamin or mineral supplement.  Be aware of gluten-free foods having contact with foods that contain gluten (cross-contamination). This can happen at home and with any processed foods. ? Talk with your health care provider or dietitian about how to reduce the risk of cross-contamination in your home. ? If you have questions about how a food is processed, ask the manufacturer. What foods can I eat? Fruits All plain fresh, frozen, canned, and dried fruits, and 100% fruit juices. Vegetables All plain fresh, frozen, and canned vegetables. Grains Amaranth, bean flours, 100% buckwheat flour, corn, millet, nut flours or nut meals, GF oats, quinoa, rice, sorghum, teff, rice wafers, pure cornmeal tortillas, popcorn, and hot cereals made from cornmeal.  Hominy, rice, wild rice. Some Asian rice noodles or bean noodles.  Arrowroot starch, corn bran, corn flour, corn germ, cornmeal, corn starch, potato flour, potato starch flour, and rice bran. Plain, brown, and sweet rice flours. Rice polish, soy flour, and tapioca starch. Meats and other protein foods All fresh beef, pork, poultry, fish, seafood, and eggs. Fish canned in water, oil, brine, or vegetable broth. Plain nuts and seeds, peanut butter.  Some precooked or cured meat, such as sausages or meat loaves. Some frankfurters. Dried beans, dried peas, and lentils. Dairy Fresh plain, dry, evaporated, or  condensed milk. Cream, butter, sour cream, whipping cream, and most yogurts. Unprocessed cheese, most processed cheeses, some cottage cheeses, some cream cheeses. Beverages Coffee, tea, most herbal teas. Carbonated beverages and  some root beers. Wine, sake, and distilled spirits, such as gin, vodka, and whiskey. Most hard ciders. Fats and oils Butter, margarine, vegetable oil, hydrogenated butter, olive oil, shortening, lard, cream, and some mayonnaise. Some commercial salad dressings. Olives. Sweets and desserts Sugar, honey, some syrups, molasses, jelly, and jam. Plain hard candy, marshmallows, and gumdrops.  Pure cocoa powder. Plain chocolate. Custard and some pudding mixes. Gelatin desserts, sorbets, frozen ice pops, and sherbet.  Cake, cookies, and other desserts prepared with allowed flours. Some commercial ice creams. Cornstarch, tapioca, and rice puddings. Seasoning and other foods Some canned or frozen soups. Monosodium glutamate (MSG). Cider, rice, and wine vinegar. Baking soda and baking powder.  Cream of tartar. Baking and nutritional yeast. Certain soy sauces made without wheat (ask your dietitian about specific brands that are allowed).  Nuts, coconut, and chocolate. Salt, pepper, herbs, spices, flavoring extracts, imitation or artificial flavorings, natural flavorings, and food colorings.  Some medicines and supplements. Rice syrups. The items listed above may not be a complete list of foods and beverages you can eat. Contact a dietitian for more information. What foods should I avoid? Fruits Thickened or prepared fruits and some pie fillings. Some fruit snacks and fruit roll-ups. Vegetables Most creamed vegetables and most vegetables canned in sauces. Some commercially prepared vegetables and salads. Vegetables in a soy sauce marinade or dressing. Grains Barley, bran, bulgur, couscous, cracked wheat, [place], farro, graham, malt, matzo, semolina, wheat germ, and all wheat and rye cereals including spelt and kamut.  Cereals containing malt as a flavoring, such as rice cereal. Noodles, spaghetti, macaroni, most packaged rice mixes, and all mixes containing wheat, rye, barley, or triticale. Meats and other  protein foods Any meat or meat alternative containing wheat, rye, barley, or gluten stabilizers. These are often marinated or packaged meats, and precooked or cured meat, such as sausages or meat loaves. Bread-containing products, such as Swiss steak, croquettes, meatballs, and meatloaf. Most tuna canned in vegetable broth and [place] with hydrolyzed vegetable protein (HVP) injected as part of the basting. Seitan. Imitation fish. Eggs in sauces made from ingredients to avoid. Dairy Commercial chocolate milk drinks and malted milk. Some non-dairy creamers. Any cheese product containing ingredients to avoid. Beverages Certain cereal beverages. Beer, ale, malted milk, and some root beers. Some hard ciders. Some instant flavored coffees. Some herbal teas made with barley or with barley malt added. Fats and oils Some commercial salad dressings. Sour cream containing modified food starch. Sweets and desserts Some toffees. Chocolate-coated nuts (may be rolled in wheat flour) and some commercial candies and candy bars.  Most cakes, cookies, donuts, pastries, and other baked goods. Some commercial ice cream. Ice cream cones.  Commercially prepared mixes for cakes, cookies, and other desserts. Bread pudding and other puddings thickened with flour.  Products containing brown rice syrup made with barley malt enzyme. Desserts and sweets made with malt flavoring. Seasoning and other foods Some curry powders, some dry seasoning mixes, some gravy extracts, some meat sauces, some ketchups, some prepared mustards, and horseradish.  Certain soy sauces. Malt vinegar. Bouillon and bouillon cubes that contain HVP. Some chip dips, and some chewing gum.  Yeast extract. Brewer's yeast. Caramel color.  Some medicines and supplements. The items listed above may not be a complete list of foods and beverages you should avoid.  Contact a dietitian for more information. Summary  Gluten is a protein that is found in wheat,  rye, barley, and some other grains. The gluten-free diet includes all foods that do not contain gluten.  If you need help finding gluten-free foods or if you have questions, talk with your dietitian or your health care provider.  Read all food labels. Gluten is often added to foods. Always check the ingredient list and look for warnings, such as "may contain gluten." This information is not intended to replace advice given to you by your health care provider. Make sure you discuss any questions you have with your health care provider. Document Revised: 12/24/2018 Document Reviewed: 12/24/2018 Elsevier Patient Education  2021 [employer].  Due to recent changes in healthcare laws, you may see the results of your imaging and laboratory studies on MyChart before your provider has had a chance to review them.  We understand that in some cases there may be results that are confusing or concerning to you. Not all laboratory results come back in the same time frame and the provider may be waiting for multiple results in order to interpret others.  Please give [place] 48 hours in order for your provider to thoroughly review all the results before contacting the office for clarification of your results.   I appreciate the  opportunity to care for you  Thank You   Marsa Aris , MD

## 2020-05-07 ENCOUNTER — Encounter: Payer: Self-pay | Admitting: Gastroenterology

## 2020-05-13 ENCOUNTER — Other Ambulatory Visit: Payer: Self-pay

## 2020-05-13 ENCOUNTER — Ambulatory Visit (AMBULATORY_SURGERY_CENTER): Payer: BC Managed Care – PPO | Admitting: Gastroenterology

## 2020-05-13 ENCOUNTER — Encounter: Payer: Self-pay | Admitting: Gastroenterology

## 2020-05-13 VITALS — BP 106/56 | HR 56 | Temp 96.9°F | Resp 10 | Ht 68.0 in | Wt 162.0 lb

## 2020-05-13 DIAGNOSIS — K2980 Duodenitis without bleeding: Secondary | ICD-10-CM | POA: Diagnosis not present

## 2020-05-13 DIAGNOSIS — K900 Celiac disease: Secondary | ICD-10-CM | POA: Diagnosis not present

## 2020-05-13 DIAGNOSIS — Z1211 Encounter for screening for malignant neoplasm of colon: Secondary | ICD-10-CM | POA: Diagnosis not present

## 2020-05-13 MED ORDER — SODIUM CHLORIDE 0.9 % IV SOLN: 500.0000 mL | Freq: Once | INTRAVENOUS | Status: DC

## 2020-05-13 NOTE — Patient Instructions (Signed)
Please read handouts provided. Continue present medications. Await pathology results. Gluten free diet indefinitely. Return to GI office in 3 months.    YOU HAD AN ENDOSCOPIC PROCEDURE TODAY AT THE [facility]:   Refer to the procedure report that was given to you for any specific questions about what was found during the examination.  If the procedure report does not answer your questions, please call your gastroenterologist to clarify.  If you requested that your care partner not be given the details of your procedure findings, then the procedure report has been included in a sealed envelope for you to review at your convenience later.  YOU SHOULD EXPECT: Some feelings of bloating in the abdomen. Passage of more gas than usual.  Walking can help get rid of the air that was put into your GI tract during the procedure and reduce the bloating. If you had a lower endoscopy (such as a colonoscopy or flexible sigmoidoscopy) you may notice spotting of blood in your stool or on the toilet paper. If you underwent a bowel prep for your procedure, you may not have a normal bowel movement for a few days.  Please Note:  You might notice some irritation and congestion in your nose or some drainage.  This is from the oxygen used during your procedure.  There is no need for concern and it should clear up in a day or so.  SYMPTOMS TO REPORT IMMEDIATELY:    Following upper endoscopy (EGD)  Vomiting of blood or coffee ground material  New chest pain or pain under the shoulder blades  Painful or persistently difficult swallowing  New shortness of breath  Fever of 100F or higher  Black, tarry-looking stools  For urgent or emergent issues, a gastroenterologist can be reached at any hour by calling [phone]. Do not use MyChart messaging for urgent concerns.    DIET:  We do recommend a small meal at first, but then you may proceed to your regular diet.  Drink plenty of fluids but you  should avoid alcoholic beverages for 24 hours.  ACTIVITY:  You should plan to take it easy for the rest of today and you should NOT DRIVE or use heavy machinery until tomorrow (because of the sedation medicines used during the test).    FOLLOW UP: Our staff will call the number listed on your records 48-72 hours following your procedure to check on you and address any questions or concerns that you may have regarding the information given to you following your procedure. If we do not reach you, we will leave a message.  We will attempt to reach you two times.  During this call, we will ask if you have developed any symptoms of COVID 19. If you develop any symptoms (ie: fever, flu-like symptoms, shortness of breath, cough etc.) before then, please call [phone].  If you test positive for Covid 19 in the 2 weeks post procedure, please call and report this information to [place].    If any biopsies were taken you will be contacted by phone or by letter within the next 1-3 weeks.  Please call us at [phone] if you have not heard about the biopsies in 3 weeks.    SIGNATURES/CONFIDENTIALITY: You and/or your care partner have signed paperwork which will be entered into your electronic medical record.  These signatures attest to the fact that that the information above on your After Visit Summary has been reviewed and is understood.  Full responsibility of the  confidentiality of this discharge information lies with you and/or your care-partner. 

## 2020-05-13 NOTE — Progress Notes (Signed)
To pacu, VSS. Report to [email or web address] 

## 2020-05-13 NOTE — Progress Notes (Signed)
Called to room to assist during endoscopic procedure.  Patient ID and intended procedure confirmed with present staff. Received instructions for my participation in the procedure from the performing physician.  

## 2020-05-13 NOTE — Progress Notes (Signed)
CHECK-IN-AER  VITAL SIGNS-[place]

## 2020-05-13 NOTE — Op Note (Signed)
[facility] Patient Name: Teresa [name] Procedure Date: 05/13/2020 9:35 AM MRN: [id number] Endoscopist: Napoleon Form , MD Age: 25 Referring MD:  Date of Birth: May 06, 1995 Gender: Female Account #: [id number] Procedure:                Upper GI endoscopy Indications:              Positive celiac serologies Medicines:                Monitored Anesthesia Care Procedure:                Pre-Anesthesia Assessment:                           - Prior to the procedure, a History and Physical                            was performed, and patient medications and                            allergies were reviewed. The patient's tolerance of                            previous anesthesia was also reviewed. The risks                            and benefits of the procedure and the sedation                            options and risks were discussed with the patient.                            All questions were answered, and informed consent                            was obtained. Prior Anticoagulants: The patient has                            taken no previous anticoagulant or antiplatelet                            agents. ASA Grade Assessment: II - A patient with                            mild systemic disease. After reviewing the risks                            and benefits, the patient was deemed in                            satisfactory condition to undergo the procedure.                           After obtaining informed consent, the endoscope was  passed under direct vision. Throughout the                            procedure, the patient's blood pressure, pulse, and                            oxygen saturations were monitored continuously. The                            Endoscope was introduced through the mouth, and                            advanced to the second part of duodenum. The upper                            GI endoscopy was  accomplished without difficulty.                            The patient tolerated the procedure well. Scope In: Scope Out: Findings:                 The Z-line was regular and was found 40 cm from the                            incisors.                           The gastroesophageal flap valve was visualized                            endoscopically and classified as Hill Grade II                            (fold present, opens with respiration).                           The examined esophagus was normal.                           The entire examined stomach was normal.                           The cardia and gastric fundus were normal on                            retroflexion.                           Decreased folds were found in the duodenal bulb,                            decreased folds were found in the second portion of                            the duodenum, flattening was found in the duodenal  bulb, scalloped mucosa was found in the duodenal                            bulb and scalloped mucosa was found in the second                            portion of the duodenum. Biopsies for histology                            were taken with a cold forceps for evaluation of                            celiac disease. Complications:            No immediate complications. Estimated Blood Loss:     Estimated blood loss was minimal. Impression:               - Z-line regular, 40 cm from the incisors.                           - Gastroesophageal flap valve classified as Hill                            Grade II (fold present, opens with respiration).                           - Normal esophagus.                           - Normal stomach.                           - Duodenal mucosal changes seen, diagnostic of                            celiac disease. Biopsied. Recommendation:           - Patient has a contact number available for                             emergencies. The signs and symptoms of potential                            delayed complications were discussed with the                            patient. Return to normal activities tomorrow.                            Written discharge instructions were provided to the                            patient.                           - Continue present medications.                           -  Await pathology results.                           - Gluten free diet indefinitely.                           - Return to GI office in 3 months. Napoleon Form, MD 05/13/2020 9:54:57 AM This report has been signed electronically.

## 2020-05-15 ENCOUNTER — Telehealth: Payer: Self-pay

## 2020-05-15 NOTE — Telephone Encounter (Signed)
  Follow up Call-  Call back number 05/13/2020  Post procedure Call Back phone  # [phone]  Permission to leave phone message Yes  Some recent data might be hidden     Patient questions:  Do you have a fever, pain , or abdominal swelling? No. Pain Score  0 *  Have you tolerated food without any problems? Yes.    Have you been able to return to your normal activities? Yes.    Do you have any questions about your discharge instructions: Diet   No. Medications  No. Follow up visit  No.  Do you have questions or concerns about your Care? No.  Actions: * If pain score is 4 or above: No action needed, pain <4.   1. Have you developed a fever since your procedure? No   2.   Have you had an respiratory symptoms (SOB or cough) since your procedure? No   3.   Have you tested positive for COVID 19 since your procedure no   4.   Have you had any family members/close contacts diagnosed with the COVID 19 since your procedure?  No    If yes to any of these questions please route to Laverna Peace, RN and Karlton Lemon, RN

## 2020-05-20 ENCOUNTER — Ambulatory Visit (HOSPITAL_COMMUNITY): Payer: BC Managed Care – PPO | Attending: Internal Medicine

## 2020-05-20 ENCOUNTER — Other Ambulatory Visit: Payer: Self-pay

## 2020-05-20 DIAGNOSIS — R011 Cardiac murmur, unspecified: Secondary | ICD-10-CM

## 2020-05-20 LAB — ECHOCARDIOGRAM COMPLETE
Area-P 1/2: 4.49 cm2
S' Lateral: 2.8 cm

## 2020-06-01 NOTE — Progress Notes (Signed)
Good news Echocardioram  is normal and shows no significant abnormality   valves look normal  at [facility] time   No further testing needed. Anemia can cause finding of murmur.

## 2020-06-15 ENCOUNTER — Other Ambulatory Visit: Payer: Self-pay

## 2020-06-16 ENCOUNTER — Other Ambulatory Visit (INDEPENDENT_AMBULATORY_CARE_PROVIDER_SITE_OTHER): Payer: BC Managed Care – PPO

## 2020-06-16 ENCOUNTER — Other Ambulatory Visit: Payer: BC Managed Care – PPO

## 2020-06-16 DIAGNOSIS — Z0000 Encounter for general adult medical examination without abnormal findings: Secondary | ICD-10-CM | POA: Diagnosis not present

## 2020-06-16 DIAGNOSIS — D509 Iron deficiency anemia, unspecified: Secondary | ICD-10-CM | POA: Diagnosis not present

## 2020-06-16 DIAGNOSIS — K900 Celiac disease: Secondary | ICD-10-CM

## 2020-06-16 DIAGNOSIS — K909 Intestinal malabsorption, unspecified: Secondary | ICD-10-CM | POA: Diagnosis not present

## 2020-06-16 DIAGNOSIS — R011 Cardiac murmur, unspecified: Secondary | ICD-10-CM

## 2020-06-16 LAB — CBC WITH DIFFERENTIAL/PLATELET
Basophils Absolute: 0 10*3/uL (ref 0.0–0.1)
Basophils Relative: 0.9 % (ref 0.0–3.0)
Eosinophils Absolute: 0 10*3/uL (ref 0.0–0.7)
Eosinophils Relative: 0.9 % (ref 0.0–5.0)
HCT: 36.7 % (ref 36.0–46.0)
Hemoglobin: 11.9 g/dL — ABNORMAL LOW (ref 12.0–15.0)
Lymphocytes Relative: 38 % (ref 12.0–46.0)
Lymphs Abs: 1.8 10*3/uL (ref 0.7–4.0)
MCHC: 32.5 g/dL (ref 30.0–36.0)
MCV: 84.2 fl (ref 78.0–100.0)
Monocytes Absolute: 0.4 10*3/uL (ref 0.1–1.0)
Monocytes Relative: 9 % (ref 3.0–12.0)
Neutro Abs: 2.4 10*3/uL (ref 1.4–7.7)
Neutrophils Relative %: 51.2 % (ref 43.0–77.0)
Platelets: 260 10*3/uL (ref 150.0–400.0)
RBC: 4.36 Mil/uL (ref 3.87–5.11)
RDW: 13.8 % (ref 11.5–15.5)
WBC: 4.6 10*3/uL (ref 4.0–10.5)

## 2020-06-16 LAB — IBC + FERRITIN
Ferritin: 3.7 ng/mL — ABNORMAL LOW (ref 10.0–291.0)
Iron: 27 ug/dL — ABNORMAL LOW (ref 42–145)
Saturation Ratios: 4.6 % — ABNORMAL LOW (ref 20.0–50.0)
Transferrin: 419 mg/dL — ABNORMAL HIGH (ref 212.0–360.0)

## 2020-06-16 LAB — T4, FREE: Free T4: 0.97 ng/dL (ref 0.60–1.60)

## 2020-06-16 LAB — COMPREHENSIVE METABOLIC PANEL
ALT: 13 U/L (ref 0–35)
AST: 17 U/L (ref 0–37)
Albumin: 4.2 g/dL (ref 3.5–5.2)
Alkaline Phosphatase: 47 U/L (ref 39–117)
BUN: 14 mg/dL (ref 6–23)
CO2: 22 mEq/L (ref 19–32)
Calcium: 9.1 mg/dL (ref 8.4–10.5)
Chloride: 108 mEq/L (ref 96–112)
Creatinine, Ser: 0.98 mg/dL (ref 0.40–1.20)
GFR: 81.32 mL/min (ref >60.00)
Glucose, Bld: 84 mg/dL (ref 70–99)
Potassium: 4.2 mEq/L (ref 3.5–5.1)
Sodium: 140 mEq/L (ref 135–145)
Total Bilirubin: 0.5 mg/dL (ref 0.2–1.2)
Total Protein: 6.9 g/dL (ref 6.0–8.3)

## 2020-06-16 LAB — TSH: TSH: 1.56 u[IU]/mL (ref 0.35–4.50)

## 2020-06-17 LAB — TISSUE TRANSGLUTAMINASE ABS,IGG,IGA
(tTG) Ab, IgA: 24.3 U/mL — ABNORMAL HIGH
(tTG) Ab, IgG: 3.7 U/mL

## 2020-06-18 ENCOUNTER — Other Ambulatory Visit: Payer: Self-pay

## 2020-06-18 DIAGNOSIS — K900 Celiac disease: Secondary | ICD-10-CM

## 2020-06-19 NOTE — Progress Notes (Signed)
Anemia better 11.9 still low iron saturation, thyroid normal levels The current medical regimen is effective;  continue present plan and medications. Gluten free and iron and  labs cbc and iron studies   in 6 months or as per Dr Lavon Paganini advice.

## 2020-10-01 DIAGNOSIS — Z3040 Encounter for surveillance of contraceptives, unspecified: Secondary | ICD-10-CM | POA: Diagnosis not present

## 2020-10-01 DIAGNOSIS — Z113 Encounter for screening for infections with a predominantly sexual mode of transmission: Secondary | ICD-10-CM | POA: Diagnosis not present

## 2020-10-01 DIAGNOSIS — Z01419 Encounter for gynecological examination (general) (routine) without abnormal findings: Secondary | ICD-10-CM | POA: Diagnosis not present

## 2020-10-01 DIAGNOSIS — Z6823 Body mass index (BMI) 23.0-23.9, adult: Secondary | ICD-10-CM | POA: Diagnosis not present

## 2020-11-25 ENCOUNTER — Telehealth: Payer: Self-pay | Admitting: Gastroenterology

## 2020-11-25 NOTE — Telephone Encounter (Signed)
Patient provider is Dr. Lavon Paganini  f/u scheduled for  02/09/2021 at 9:30am..

## 2020-11-25 NOTE — Telephone Encounter (Signed)
Called patient left voice mail for patient to return call to schedule appt with Dr. Chales Abrahams for 3 mth F/U

## 2021-01-01 ENCOUNTER — Telehealth: Payer: Self-pay

## 2021-01-01 NOTE — Telephone Encounter (Signed)
Spoke with the patient. 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 She states she will come next week.

## 2021-01-08 ENCOUNTER — Other Ambulatory Visit: Payer: BC Managed Care – PPO

## 2021-01-08 DIAGNOSIS — K900 Celiac disease: Secondary | ICD-10-CM

## 2021-01-12 LAB — TISSUE TRANSGLUTAMINASE ABS,IGG,IGA
(tTG) Ab, IgA: 7 U/mL
(tTG) Ab, IgG: <1 U/mL

## 2021-02-01 ENCOUNTER — Ambulatory Visit: Payer: BC Managed Care – PPO | Admitting: Gastroenterology

## 2021-02-09 ENCOUNTER — Ambulatory Visit: Payer: BC Managed Care – PPO | Admitting: Gastroenterology

## 2021-02-09 ENCOUNTER — Other Ambulatory Visit (INDEPENDENT_AMBULATORY_CARE_PROVIDER_SITE_OTHER): Payer: BC Managed Care – PPO

## 2021-02-09 ENCOUNTER — Encounter: Payer: Self-pay | Admitting: Gastroenterology

## 2021-02-09 VITALS — BP 114/76 | HR 82 | Ht 68.0 in | Wt 158.0 lb

## 2021-02-09 DIAGNOSIS — D508 Other iron deficiency anemias: Secondary | ICD-10-CM

## 2021-02-09 DIAGNOSIS — K900 Celiac disease: Secondary | ICD-10-CM | POA: Diagnosis not present

## 2021-02-09 LAB — COMPREHENSIVE METABOLIC PANEL
ALT: 8 U/L (ref 0–35)
AST: 11 U/L (ref 0–37)
Albumin: 4 g/dL (ref 3.5–5.2)
Alkaline Phosphatase: 37 U/L — ABNORMAL LOW (ref 39–117)
BUN: 15 mg/dL (ref 6–23)
CO2: 26 mEq/L (ref 19–32)
Calcium: 8.9 mg/dL (ref 8.4–10.5)
Chloride: 105 mEq/L (ref 96–112)
Creatinine, Ser: 1.06 mg/dL (ref 0.40–1.20)
GFR: 73.68 mL/min (ref >60.00)
Glucose, Bld: 93 mg/dL (ref 70–99)
Potassium: 4.4 mEq/L (ref 3.5–5.1)
Sodium: 136 mEq/L (ref 135–145)
Total Bilirubin: 0.6 mg/dL (ref 0.2–1.2)
Total Protein: 6.7 g/dL (ref 6.0–8.3)

## 2021-02-09 LAB — IBC + FERRITIN
Ferritin: 6.9 ng/mL — ABNORMAL LOW (ref 10.0–291.0)
Iron: 105 ug/dL (ref 42–145)
Saturation Ratios: 22.3 % (ref 20.0–50.0)
TIBC: 470.4 ug/dL — ABNORMAL HIGH (ref 250.0–450.0)
Transferrin: 336 mg/dL (ref 212.0–360.0)

## 2021-02-09 LAB — CBC WITH DIFFERENTIAL/PLATELET
Basophils Absolute: 0.1 10*3/uL (ref 0.0–0.1)
Basophils Relative: 1.4 % (ref 0.0–3.0)
Eosinophils Absolute: 0.1 10*3/uL (ref 0.0–0.7)
Eosinophils Relative: 3.1 % (ref 0.0–5.0)
HCT: 40.2 % (ref 36.0–46.0)
Hemoglobin: 13.3 g/dL (ref 12.0–15.0)
Lymphocytes Relative: 41.3 % (ref 12.0–46.0)
Lymphs Abs: 1.5 10*3/uL (ref 0.7–4.0)
MCHC: 33.1 g/dL (ref 30.0–36.0)
MCV: 88.3 fl (ref 78.0–100.0)
Monocytes Absolute: 0.3 10*3/uL (ref 0.1–1.0)
Monocytes Relative: 8.4 % (ref 3.0–12.0)
Neutro Abs: 1.7 10*3/uL (ref 1.4–7.7)
Neutrophils Relative %: 45.8 % (ref 43.0–77.0)
Platelets: 224 10*3/uL (ref 150.0–400.0)
RBC: 4.55 Mil/uL (ref 3.87–5.11)
RDW: 12.9 % (ref 11.5–15.5)
WBC: 3.7 10*3/uL — ABNORMAL LOW (ref 4.0–10.5)

## 2021-02-09 LAB — VITAMIN B12: Vitamin B-12: 172 pg/mL — ABNORMAL LOW (ref 211–911)

## 2021-02-09 LAB — FOLATE: Folate: 5.7 ng/mL — ABNORMAL LOW (ref >5.9)

## 2021-02-09 NOTE — Progress Notes (Signed)
Teresa [name]    [id number]    [birth year]  Primary Care Physician:Panosh, Neta Mends, MD  Referring Physician: Madelin Headings, MD [address] [place],  [place] [address]   Chief complaint: Celiac disease  HPI:  26 year old very pleasant female here for follow-up visit accompanied by her mother.  She was recently diagnosed with celiac disease in March 2022 when she was noted to have persistent iron deficiency anemia despite daily oral iron. She was having iron deficiency anemia was persistent despite taking oral iron daily.   TTG IgA antibody was undetectable based on most recent labs in January 2023, was 24 previously in June 2022  Overall she is feeling much better on strict gluten-free diet.  She is utilizing multiple resources to identify gluten-free foods and is also part of multiple discussion forms online which are helping her    Denies any melena, rectal bleeding, abdominal pain, diarrhea, skin rash or joint pain.  She does not have any specific GI symptoms other than occasional diarrhea, abdominal bloating or discomfort when she has accidental exposure to gluten.    Outpatient Encounter Medications as of 02/09/2021  Medication Sig   acetaminophen (TYLENOL) 325 MG tablet Take 650 mg by mouth every 6 (six) hours as needed.   ferrous sulfate 325 (65 FE) MG EC tablet Take 325 mg by mouth every other day.   JUNEL FE 1.5/30 1.5-30 MG-MCG tablet TAKE 1 TABLET BY MOUTH EVERY DAY   [DISCONTINUED] oxyCODONE-acetaminophen (PERCOCET/ROXICET) 5-325 MG tablet Take 1 tablet by mouth every 4 (four) hours as needed.   No facility-administered encounter medications on file as of 02/09/2021.    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   ARTICULATION DISORDER 08/03/2009   Qualifier: History of  By: Fabian Sharp MD, Neta Mends    Celiac disease    Dysmenorrhea in adolescent  08/23/2013   disc  nsaids preferred and fu if needed    Functional murmur    eval at early age    Past Surgical History:  Procedure Laterality Date   Rhino plasty     SEPTOPLASTY  APRIL 28TH 2022   WISDOM TOOTH EXTRACTION  08/2014    Family History  Problem Relation Age of Onset   ADD / ADHD Mother    Diabetes Mother        type 2    Anemia Mother    Colon polyps Mother    Irritable bowel syndrome Mother    ADD / ADHD Sister    Hyperlipidemia Other    Depression Other    Hypertension Other    Heart disease Paternal Uncle    Heart attack Maternal Grandfather    Heart attack Paternal Grandfather    Colon cancer Neg Hx    Esophageal cancer Neg Hx    Rectal cancer Neg Hx    Stomach cancer Neg Hx     Social History   Socioeconomic History   Marital status: Single    Spouse name: Not on file   Number of children: Not on file   Years of education: Not on file   Highest education level: Not on file  Occupational History   Not on file  Tobacco Use   Smoking status: Never   Smokeless tobacco: Never  Vaping Use   Vaping Use:  Never used  Substance and Sexual Activity   Alcohol use: Yes    Alcohol/week: 0.0 standard drinks    Comment: rarely    Drug use: No   Sexual activity: Not Currently  Other Topics Concern   Not on file  Social History Narrative   [employer]  t [employer].    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   Physical Exam: Vitals:   02/09/21 0934  BP: 114/76  Pulse: 82   Body mass index is 24.02 kg/m. Gen:      No acute distress HEENT:  sclera anicteric Abd:      soft, non-tender; no palpable masses, no distension Ext:    No edema Neuro: alert and oriented x 3 Psych: normal mood and affect  Data Reviewed:  Reviewed labs, radiology imaging, old records and pertinent past GI work up   Assessment and Plan/Recommendations:  26 year old very pleasant female with celiac disease and iron deficiency Continue strictly gluten-free diet  TTG IgA antibody was undetectable in January 2023, will recheck in 1 year.  Recall placed for December 2023 Follow-up CBC and CMP Iron deficiency anemia: Recheck iron panel We will also check B12 and folate  Return in 1 year or sooner if needed  The patient was provided an opportunity to ask questions and all were answered. The patient agreed with the plan and demonstrated an understanding of the instructions.  Iona Beard , MD    CC: Panosh, Neta Mends, MD

## 2021-02-09 NOTE — Patient Instructions (Signed)
Your provider has requested that you go to the basement level for lab work before leaving today. Press "B" on the elevator. The lab is located at the first door on the left as you exit the elevator.   If you are age 25 or older, your body mass index should be between 23-30. Your Body mass index is 24.02 kg/m. If this is out of the aforementioned range listed, please consider follow up with your Primary Care Provider.  If you are age 71 or younger, your body mass index should be between 19-25. Your Body mass index is 24.02 kg/m. If this is out of the aformentioned range listed, please consider follow up with your Primary Care Provider.   ________________________________________________________  The [facility] providers would like to encourage you to use [facility] to communicate with providers for non-urgent requests or questions.  Due to long hold times on the telephone, sending your provider a message by [facility] may be a faster and more efficient way to get a response.  Please allow 48 business hours for a response.  Please remember that this is for non-urgent requests.  _______________________________________________________   Due to recent changes in healthcare laws, you may see the results of your imaging and laboratory studies on MyChart before your provider has had a chance to review them.  We understand that in some cases there may be results that are confusing or concerning to you. Not all laboratory results come back in the same time frame and the provider may be waiting for multiple results in order to interpret others.  Please give [place] 48 hours in order for your provider to thoroughly review all the results before contacting the office for clarification of your results.

## 2021-02-16 ENCOUNTER — Other Ambulatory Visit: Payer: Self-pay

## 2021-02-16 ENCOUNTER — Other Ambulatory Visit: Payer: Self-pay | Admitting: Gastroenterology

## 2021-02-16 MED ORDER — CYANOCOBALAMIN 1000 MCG/ML IJ SOLN: INTRAMUSCULAR | 14 refills | Status: DC

## 2021-02-16 MED ORDER — FERROUS BISGLYCINATE CHELATE 28 MG PO CAPS: ORAL_CAPSULE | ORAL | 11 refills | Status: DC

## 2021-02-18 ENCOUNTER — Encounter: Payer: Self-pay | Admitting: Gastroenterology

## 2021-02-19 ENCOUNTER — Ambulatory Visit (INDEPENDENT_AMBULATORY_CARE_PROVIDER_SITE_OTHER): Payer: BC Managed Care – PPO | Admitting: Gastroenterology

## 2021-02-19 DIAGNOSIS — D508 Other iron deficiency anemias: Secondary | ICD-10-CM

## 2021-02-19 MED ORDER — CYANOCOBALAMIN 1000 MCG/ML IJ SOLN
1000.0000 ug | Freq: Once | INTRAMUSCULAR | Status: AC
  Administered 2021-02-19: 1000 ug via INTRAMUSCULAR

## 2021-02-26 ENCOUNTER — Ambulatory Visit (INDEPENDENT_AMBULATORY_CARE_PROVIDER_SITE_OTHER): Payer: BC Managed Care – PPO | Admitting: Gastroenterology

## 2021-02-26 ENCOUNTER — Other Ambulatory Visit: Payer: Self-pay

## 2021-02-26 DIAGNOSIS — K909 Intestinal malabsorption, unspecified: Secondary | ICD-10-CM

## 2021-02-26 DIAGNOSIS — E538 Deficiency of other specified B group vitamins: Secondary | ICD-10-CM

## 2021-02-26 MED ORDER — CYANOCOBALAMIN 1000 MCG/ML IJ SOLN
1000.0000 ug | INTRAMUSCULAR | Status: AC
  Administered 2021-02-26 – 2021-03-12 (×2): 1000 ug via INTRAMUSCULAR

## 2021-03-05 ENCOUNTER — Encounter: Payer: BC Managed Care – PPO | Admitting: Gastroenterology

## 2021-03-05 DIAGNOSIS — E538 Deficiency of other specified B group vitamins: Secondary | ICD-10-CM

## 2021-03-05 DIAGNOSIS — D508 Other iron deficiency anemias: Secondary | ICD-10-CM

## 2021-03-12 ENCOUNTER — Ambulatory Visit (INDEPENDENT_AMBULATORY_CARE_PROVIDER_SITE_OTHER): Payer: BC Managed Care – PPO | Admitting: Gastroenterology

## 2021-03-12 DIAGNOSIS — K909 Intestinal malabsorption, unspecified: Secondary | ICD-10-CM

## 2021-03-12 DIAGNOSIS — E538 Deficiency of other specified B group vitamins: Secondary | ICD-10-CM | POA: Diagnosis not present

## 2021-03-15 ENCOUNTER — Encounter: Payer: BC Managed Care – PPO | Admitting: Internal Medicine

## 2021-03-24 IMAGING — CT CT MAXILLOFACIAL W/O CM
3 of 5 series · 15 of 47 positions shown, 18 images · non-contrast
Comparison: None.

CLINICAL DATA: Chronic sinusitis.

EXAM:
CT MAXILLOFACIAL WITHOUT CONTRAST
TECHNIQUE: Multidetector CT images of the paranasal sinuses were obtained using
the standard protocol without intravenous contrast.

[Series 4: sinus 2.00 hr60 s3 cor · coronal · 0.24mm/px · 3 of 71 slices shown]
[im 24/71  bone]
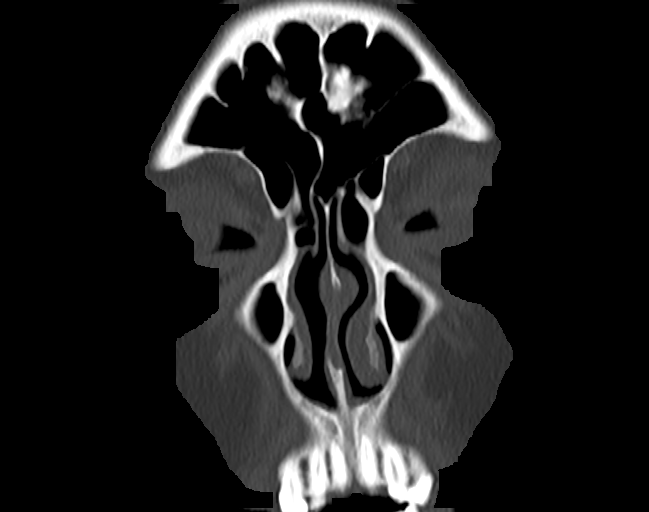
[im 32/71  bone]
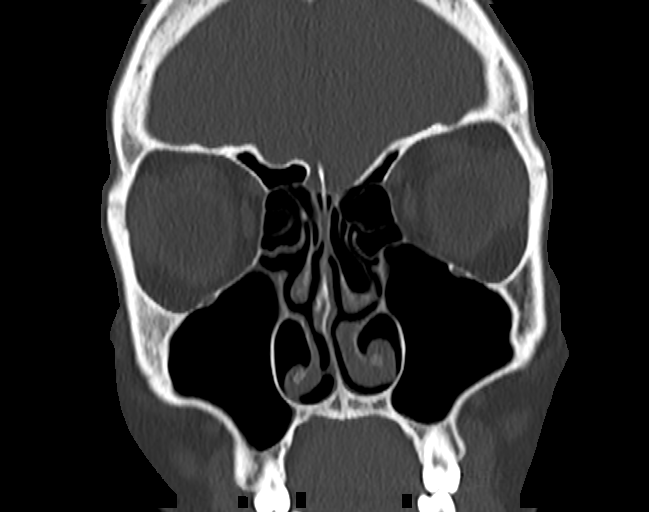
[im 39/71  bone]
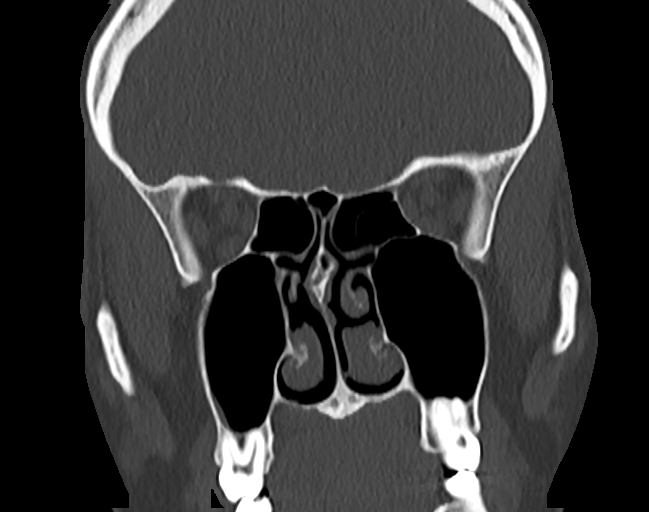

[Series 6: sinus 2.00 hr60 s3 sag · sagittal · 0.24mm/px · 3 of 79 slices shown]
[im 27/79  bone]
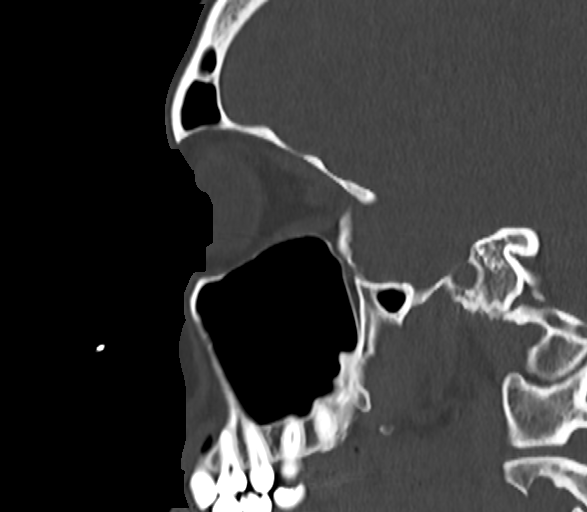
[im 40/79  bone]
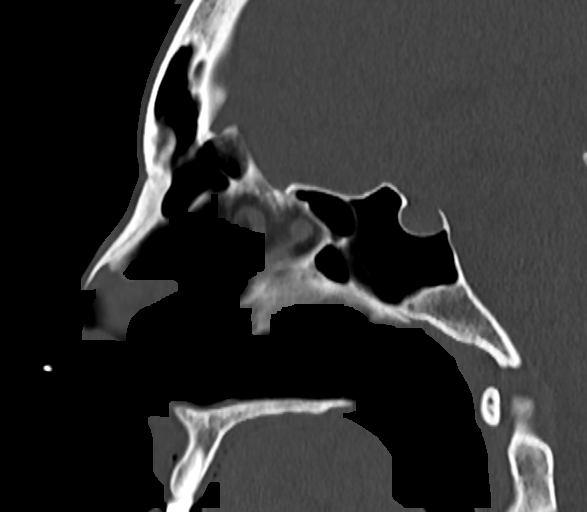
[im 53/79  bone]
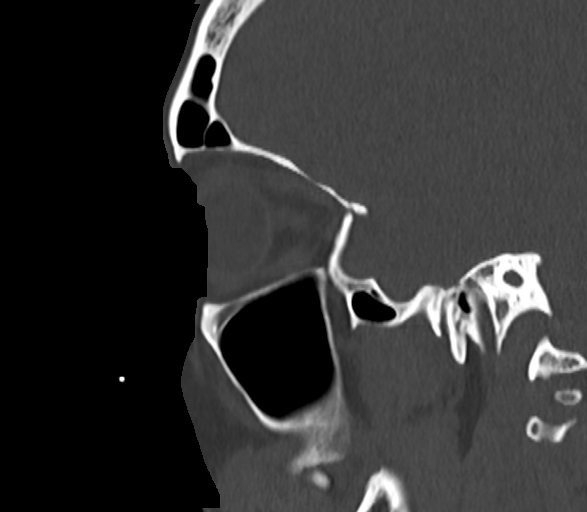

[Series 12: sinus 1.00 hr60 s3 axial fusion thins · axial · 0.34mm/px · z∈[-637,-525]mm · 9 of 211 slices shown, 12 images]
[im 12/211  brain]
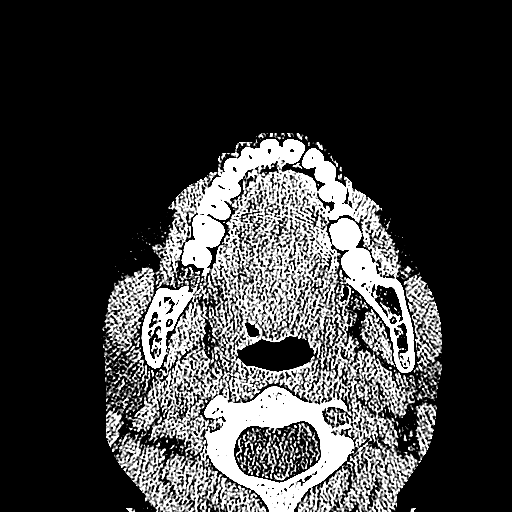
[im 12/211  bone]
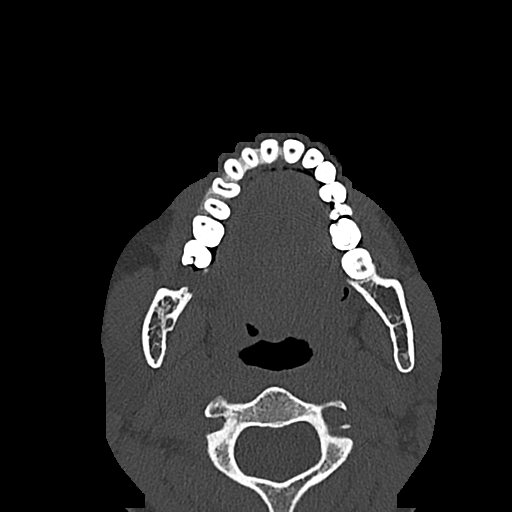
[im 36/211  bone]
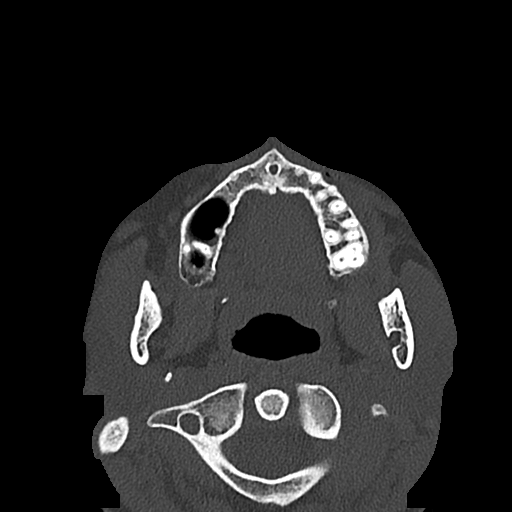
[im 59/211  bone]
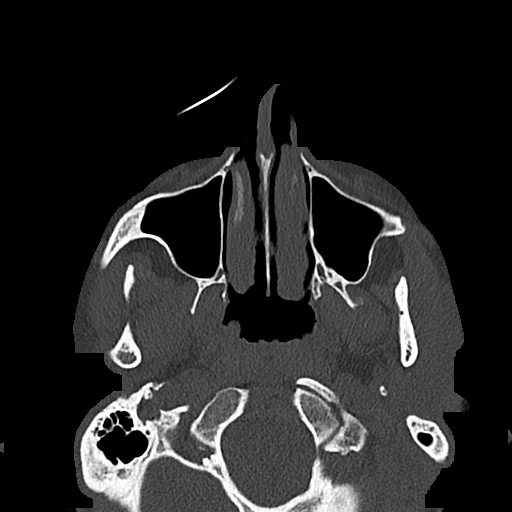
[im 82/211  bone]
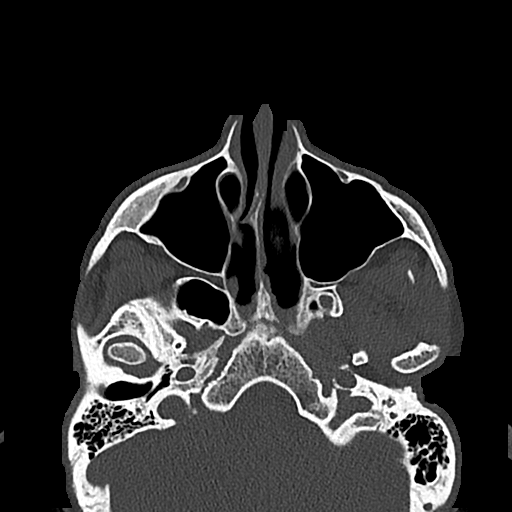
[im 106/211  brain]
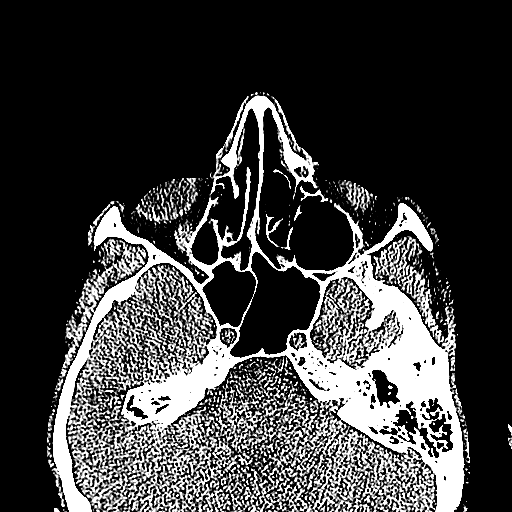
[im 106/211  bone]
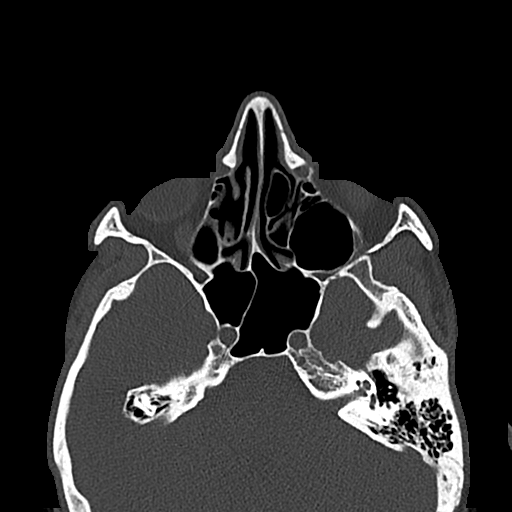
[im 129/211  bone]
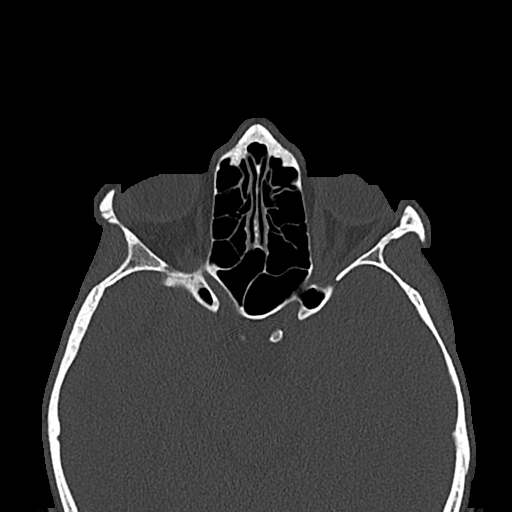
[im 152/211  bone]
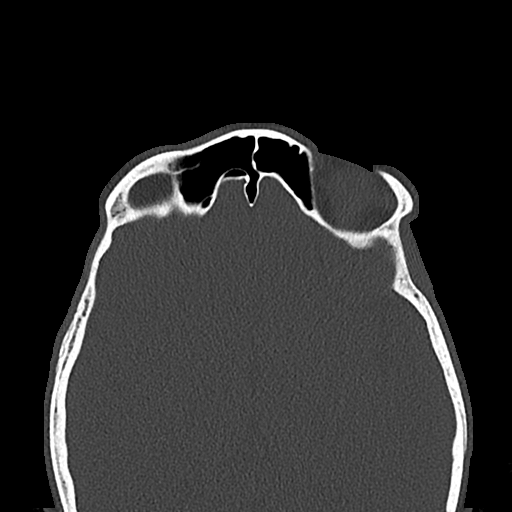
[im 176/211  bone]
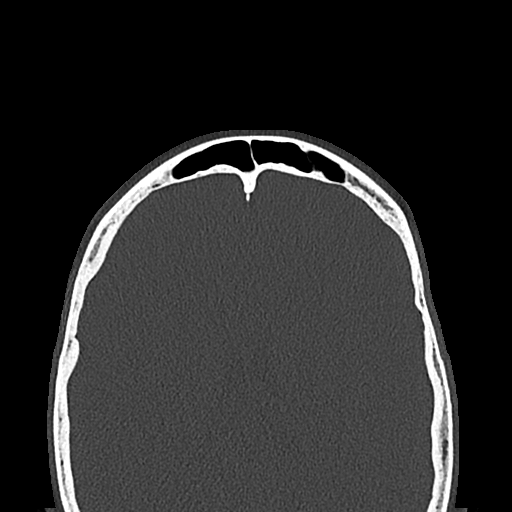
[im 199/211  brain]
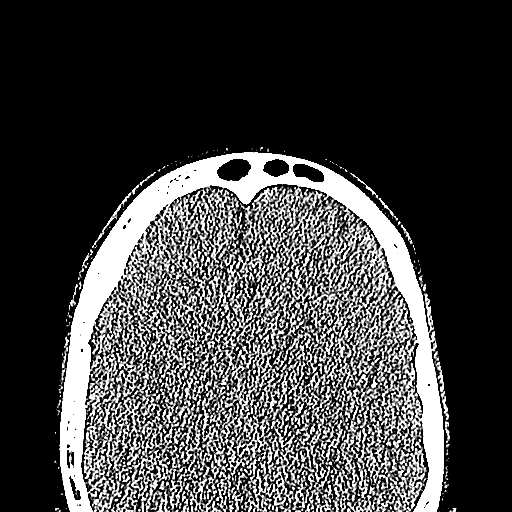
[im 199/211  bone]
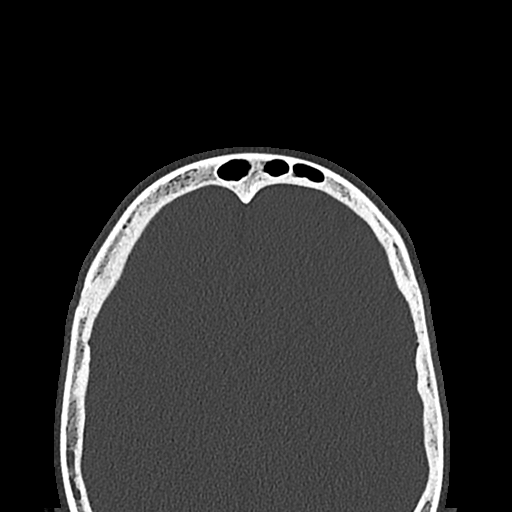

[15 of 47 positions shown; findings below may reference images not displayed]

FINDINGS: Paranasal sinuses:

Frontal: Normally aerated. Patent frontal sinus drainage pathways.

Ethmoid: Normally aerated.

Maxillary: Normally aerated.

Sphenoid: Normally aerated. Patent sphenoethmoidal recesses.

Right ostiomeatal unit: Patent.

Left ostiomeatal unit: Patent.

Nasal passages: Patent. Bowing of the nasal septum to the right with
associated bony spur contacting the posterior aspect of the right
middle turbinate. Left middle concha bullosa.

Anatomy: Pneumatization superior to right anterior ethmoid notch.
Asymmetric but intact olfactory grooves and fovea ethmoidalis, Keros
II (4-7mm) on the right and Keros I (1-3mm) on the left. Sellar
sphenoid pneumatization pattern. Protrusion of the bilateral carotid
canals and bilateral foramen rotundum into the sphenoid sinus
without dehiscence. No Onodi cell.

Other: Orbits and intracranial compartment are unremarkable. Visible
mastoid air cells are normally aerated.
IMPRESSION: Normally aerated paranasal sinuses. Patent sinus drainage pathways.

## 2021-04-16 ENCOUNTER — Ambulatory Visit (INDEPENDENT_AMBULATORY_CARE_PROVIDER_SITE_OTHER): Payer: BC Managed Care – PPO | Admitting: Gastroenterology

## 2021-04-16 DIAGNOSIS — E538 Deficiency of other specified B group vitamins: Secondary | ICD-10-CM

## 2021-04-16 MED ORDER — CYANOCOBALAMIN 1000 MCG/ML IJ SOLN
1000.0000 ug | Freq: Once | INTRAMUSCULAR | Status: AC
  Administered 2021-04-16: 1000 ug via INTRAMUSCULAR

## 2021-04-21 ENCOUNTER — Ambulatory Visit (INDEPENDENT_AMBULATORY_CARE_PROVIDER_SITE_OTHER): Payer: BC Managed Care – PPO | Admitting: Internal Medicine

## 2021-04-21 ENCOUNTER — Encounter: Payer: Self-pay | Admitting: Internal Medicine

## 2021-04-21 VITALS — BP 120/80 | HR 59 | Temp 98.6°F | Ht 68.75 in | Wt 163.0 lb

## 2021-04-21 DIAGNOSIS — E538 Deficiency of other specified B group vitamins: Secondary | ICD-10-CM

## 2021-04-21 DIAGNOSIS — Z0000 Encounter for general adult medical examination without abnormal findings: Secondary | ICD-10-CM

## 2021-04-21 DIAGNOSIS — E611 Iron deficiency: Secondary | ICD-10-CM | POA: Diagnosis not present

## 2021-04-21 DIAGNOSIS — D649 Anemia, unspecified: Secondary | ICD-10-CM | POA: Diagnosis not present

## 2021-04-21 DIAGNOSIS — E7800 Pure hypercholesterolemia, unspecified: Secondary | ICD-10-CM | POA: Diagnosis not present

## 2021-04-21 DIAGNOSIS — K900 Celiac disease: Secondary | ICD-10-CM

## 2021-04-21 NOTE — Patient Instructions (Addendum)
Good to see you today  ?Continue lifestyle intervention healthy eating and exercise .  ?Add folic acid    otc multivit  womens ok ?Talk with Dr Lavon Paganini  for future advisability or sublingual version of b12 supplement.  ? ? ? ? ?

## 2021-04-21 NOTE — Progress Notes (Signed)
? ?Chief Complaint  ?Patient presents with  ? Annual Exam  ?  fasting  ? ? ?HPI: ?Patient  Teresa [name]  26 y.o. comes in today for Preventive Health Care visit  ?Under care for celiac disasea  gluten free  taking iron some   now on b12 shots for low b12 no sx  . ?Utd on gyne check and ocps ?No physical barriers  to exercise   ?Hasn't been donating blood since anemia and iron deficiency  ?To move to [place] for job in fall  ? ?Health Maintenance  ?Topic Date Due  ? PAP-Cervical Cytology Screening  10/21/2021 (Originally 11/21/2017)  ? COVID-19 Vaccine (4 - Booster) 10/21/2021 (Originally 02/05/2020)  ? Hepatitis C Screening  10/21/2021 (Originally 11/22/2014)  ? PAP SMEAR-Modifier  07/09/2021  ? INFLUENZA VACCINE  08/03/2021  ? TETANUS/TDAP  03/12/2028  ? HPV VACCINES  Completed  ? HIV Screening  Completed  ? ?Health Maintenance Review ?LIFESTYLE:  ?Exercise:  =walk and   ?Tobacco/ETS: no ?Alcohol:  weely at most.  ?Sugar beverages: nox 1 dr Reino [place].  ?Sleep: est 8 hours  ?Drug use: no ?HH of apt   2  no pets  ?Work: grad Consulting civil engineer  ?Pap  per gyne  ? ? ?ROS:  ?GREST of 12 system review negative except as per HPI ? ? ?Past Medical History:  ?Diagnosis Date  ? Anemia   ? Articulation delay   ? age 59 therapy  ? ARTICULATION DISORDER 08/03/2009  ? Qualifier: History of  By: Fabian Sharp MD, Neta Mends   ? Celiac disease   ? Dysmenorrhea in adolescent 08/23/2013  ? disc  nsaids preferred and fu if needed   ? Functional murmur   ? eval at early age  ? ? ?Past Surgical History:  ?Procedure Laterality Date  ? Rhino plasty    ? SEPTOPLASTY  APRIL 28TH 2022  ? WISDOM TOOTH EXTRACTION  08/2014  ? ? ?Family History  ?Problem Relation Age of Onset  ? ADD / ADHD Mother   ? Diabetes Mother   ?     type 2   ? Anemia Mother   ? Colon polyps Mother   ? Irritable bowel syndrome Mother   ? ADD / ADHD Sister   ? Hyperlipidemia Other   ? Depression Other   ? Hypertension Other   ? Heart disease Paternal Uncle   ? Heart attack Maternal Grandfather    ? Heart attack Paternal Grandfather   ? Colon cancer Neg Hx   ? Esophageal cancer Neg Hx   ? Rectal cancer Neg Hx   ? Stomach cancer Neg Hx   ? ? ?Social History  ? ?Socioeconomic History  ? Marital status: Single  ?  Spouse name: Not on file  ? Number of children: Not on file  ? Years of education: Not on file  ? Highest education level: Not on file  ?Occupational History  ? Not on file  ?Tobacco Use  ? Smoking status: Never  ? Smokeless tobacco: Never  ?Vaping Use  ? Vaping Use: Never used  ?Substance and Sexual Activity  ? Alcohol use: Yes  ?  Alcohol/week: 0.0 standard drinks  ?  Comment: rarely   ? Drug use: No  ? Sexual activity: Not Currently  ?Other Topics Concern  ? Not on file  ?Social History Narrative  ? [employer]  t [employer].    [employer]  biol changes to business  On campus   ? Good Student Up to gifted  classes next year  ? School ok  ? HH of 4  ? No ets  ? Softball and swimming   Volley ball   ?   ? Evaluation for expressive language delay and articulation problem age 37 1/2  ? Sleep 8 hours  ? ?Social Determinants of Health  ? ?Financial Resource Strain: Not on file  ?Food Insecurity: Not on file  ?Transportation Needs: Not on file  ?Physical Activity: Not on file  ?Stress: Not on file  ?Social Connections: Not on file  ? ? ?Outpatient Medications Prior to Visit  ?Medication Sig Dispense Refill  ? acetaminophen (TYLENOL) 325 MG tablet Take 650 mg by mouth every 6 (six) hours as needed.    ? cyanocobalamin (,VITAMIN B-12,) 1000 MCG/ML injection Inject 1000 mg SQ weekly x 4 then every 30 days 1 mL 14  ? Ferrous Bisglycinate Chelate POWD TAKE 2 CAPSULES BY MOUTH EVERY DAY 60 g 11  ? JUNEL FE 1.5/30 1.5-30 MG-MCG tablet TAKE 1 TABLET BY MOUTH EVERY DAY 84 tablet 1  ? ?No facility-administered medications prior to visit.  ? ? ? ?EXAM: ? ?BP 120/80 (BP Location: Right Arm, Patient Position: Sitting, Cuff Size: Normal)   Pulse (!) 59   Temp 98.6 ?F (37 ?C) (Oral)   Ht 5' 8.75" (1.746 m)   Wt 163 lb 0.3 oz  (73.9 kg)   LMP 04/20/2021   SpO2 98%   BMI 24.25 kg/m?  ? ?Body mass index is 24.25 kg/m?. ?Wt Readings from Last 3 Encounters:  ?04/21/21 163 lb 0.3 oz (73.9 kg)  ?02/09/21 158 lb (71.7 kg)  ?05/13/20 162 lb (73.5 kg)  ? ? ?Physical Exam: ?Vital signs reviewed ?[id number] is a well-developed well-nourished alert cooperative    who appearsr stated age in no acute distress.  ?HEENT: normocephalic atraumatic , Eyes: PERRL EOM's full, conjunctiva clear, Nares: paten,t no deformity discharge or tenderness., Ears: no deformity EAC's clear TMs with normal landmarks. NECK: supple without masses, thyromegaly or bruits. ?CHEST/PULM:  Clear to auscultation and percussion breath sounds equal no wheeze , rales or rhonchi. No chest wall deformities or tenderness. ?Breast: normal by inspection . No dimpling, discharge, masses, tenderness or discharge . ?CV: PMI is nondisplaced, S1 S2 no gallops, murmurs, rubs. Ocass click mid syt only when supine  Peripheral pulses are full without delay.No JVD .  ?ABDOMEN: Bowel sounds normal nontender  No guard or rebound, no hepato splenomegal no CVA tenderness.   ?Extremtities:  No clubbing cyanosis or edema, no acute joint swelling or redness no focal atrophy ?NEURO:  Oriented x3, cranial nerves 3-12 appear to be intact, no obvious focal weakness,gait within normal limits no abnormal reflexes or asymmetrical ?SKIN: No acute rashes normal turgor, color, no bruising or petechiae. ?PSYCH: Oriented, good eye contact, no obvious depression anxiety, cognition and judgment appear normal. ?LN: no cervical axillary inguinal adenopathy ? ?Lab Results  ?Component Value Date  ? WBC 3.7 (L) 02/09/2021  ? HGB 13.3 02/09/2021  ? HCT 40.2 02/09/2021  ? PLT 224.0 02/09/2021  ? GLUCOSE 93 02/09/2021  ? CHOL 183 03/12/2019  ? TRIG 110.0 03/12/2019  ? HDL 55.70 03/12/2019  ? LDLCALC 106 (H) 03/12/2019  ? ALT 8 02/09/2021  ? AST 11 02/09/2021  ? NA 136 02/09/2021  ? K 4.4 02/09/2021  ? CL 105 02/09/2021  ?  CREATININE 1.06 02/09/2021  ? BUN 15 02/09/2021  ? CO2 26 02/09/2021  ? TSH 1.56 06/16/2020  ?Ferritin 6.6  ?Lab Results  ?Component  Value Date  ? VITAMINB12 172 (L) 02/09/2021  ? ?Folate low 5.7  also   ? ?BP Readings from Last 3 Encounters:  ?04/21/21 120/80  ?02/09/21 114/76  ?05/13/20 (!) 106/56  ? ? ?Labplanreviewed with patient  add vit d level and lipids etc  ? ?ASSESSMENT AND PLAN: ? ?Discussed the following assessment and plan: ? ?  ICD-10-CM   ?1. Visit for preventive health examination  Z00.00 Lipid panel  ?  TSH  ?  VITAMIN D 25 Hydroxy (Vit-D Deficiency, Fractures)  ?  TSH  ?  Lipid panel  ?  VITAMIN D 25 Hydroxy (Vit-D Deficiency, Fractures)  ?  ?2. Celiac disease  K90.0 Lipid panel  ?  TSH  ?  VITAMIN D 25 Hydroxy (Vit-D Deficiency, Fractures)  ?  TSH  ?  Lipid panel  ?  VITAMIN D 25 Hydroxy (Vit-D Deficiency, Fractures)  ?  ?3. Iron deficiency  E61.1 Lipid panel  ?  TSH  ?  VITAMIN D 25 Hydroxy (Vit-D Deficiency, Fractures)  ?  TSH  ?  Lipid panel  ?  VITAMIN D 25 Hydroxy (Vit-D Deficiency, Fractures)  ?  ?4. B12 deficiency  E53.8 Lipid panel  ?  TSH  ?  VITAMIN D 25 Hydroxy (Vit-D Deficiency, Fractures)  ?  TSH  ?  Lipid panel  ?  VITAMIN D 25 Hydroxy (Vit-D Deficiency, Fractures)  ?  ?Overall is doing quite well things appear to be stable and is no longer [place] although is iron deficient ?Discussed vitamin supplementation ?Healthy lifestyle. ?Appears to be up-to-date on parameters. ?Yearly checkup if in the area or can get primary care she moves. ?Can discuss with GI team about using sublingual B12 instead of injections for convenience reasons when she moves.out of area.  ?Return in about 1 year (around 04/22/2022). ? ?Patient Care Team: ?Lopaka Karge, Standley Brooking, MD as PCP - General ?Linda Hedges, DO as Consulting Physician (Obstetrics and Gynecology) ?Mauri Pole, MD as Consulting Physician (Gastroenterology) ?Patient Instructions  ?Good to see you today  ?Continue lifestyle intervention healthy  eating and exercise .  ?Add folic acid    otc multivit  womens ok ?Talk with Dr Silverio Decamp  for future advisability or sublingual version of b12 supplement.  ? ? ? ? ? ?Standley Brooking. Icie Kuznicki M.D. ? ?

## 2021-04-22 LAB — TSH: TSH: 2.42 mIU/L

## 2021-04-22 LAB — LIPID PANEL
Cholesterol: 203 mg/dL — ABNORMAL HIGH (ref <200)
HDL: 66 mg/dL (ref >=50)
LDL Cholesterol (Calc): 116 mg/dL (calc) — ABNORMAL HIGH
Non-HDL Cholesterol (Calc): 137 mg/dL (calc) — ABNORMAL HIGH (ref <130)
Total CHOL/HDL Ratio: 3.1 (calc) (ref <5.0)
Triglycerides: 100 mg/dL (ref <150)

## 2021-04-22 LAB — VITAMIN D 25 HYDROXY (VIT D DEFICIENCY, FRACTURES): Vit D, 25-Hydroxy: 27 ng/mL — ABNORMAL LOW (ref 30–100)

## 2021-04-22 NOTE — Progress Notes (Signed)
Cholesterol slightly elevated from last check but still favorable.  Thyroid nl  Vit d  is slightly low .    ?A women's vitamin with folate and vit D may be ok to correct this ( otherwise take [phone] [phone] vit d 3 per day ) ?Forwarding info to your specialists

## 2021-05-11 ENCOUNTER — Other Ambulatory Visit: Payer: Self-pay

## 2021-05-11 DIAGNOSIS — E538 Deficiency of other specified B group vitamins: Secondary | ICD-10-CM

## 2021-05-11 MED ORDER — CYANOCOBALAMIN 1000 MCG/ML IJ SOLN
1000.0000 ug | INTRAMUSCULAR | Status: AC
  Administered 2021-05-21 – 2021-09-28 (×4): 1000 ug via INTRAMUSCULAR

## 2021-05-21 ENCOUNTER — Ambulatory Visit (INDEPENDENT_AMBULATORY_CARE_PROVIDER_SITE_OTHER): Payer: BC Managed Care – PPO | Admitting: Gastroenterology

## 2021-05-21 DIAGNOSIS — E538 Deficiency of other specified B group vitamins: Secondary | ICD-10-CM | POA: Diagnosis not present

## 2021-06-21 ENCOUNTER — Telehealth: Payer: Self-pay | Admitting: Gastroenterology

## 2021-06-21 ENCOUNTER — Ambulatory Visit (INDEPENDENT_AMBULATORY_CARE_PROVIDER_SITE_OTHER): Payer: BC Managed Care – PPO | Admitting: Gastroenterology

## 2021-06-21 DIAGNOSIS — D508 Other iron deficiency anemias: Secondary | ICD-10-CM

## 2021-06-21 DIAGNOSIS — E538 Deficiency of other specified B group vitamins: Secondary | ICD-10-CM | POA: Diagnosis not present

## 2021-06-21 DIAGNOSIS — K900 Celiac disease: Secondary | ICD-10-CM

## 2021-06-21 DIAGNOSIS — K909 Intestinal malabsorption, unspecified: Secondary | ICD-10-CM

## 2021-06-21 MED ORDER — CYANOCOBALAMIN 1000 MCG/ML IJ SOLN
1000.0000 ug | Freq: Once | INTRAMUSCULAR | Status: AC
  Administered 2021-06-21: 1000 ug via INTRAMUSCULAR

## 2021-06-24 MED ORDER — CYANOCOBALAMIN 1000 MCG/ML IJ SOLN: INTRAMUSCULAR | 6 refills | Status: DC

## 2021-06-24 NOTE — Telephone Encounter (Signed)
Called patient back and told her 3 month supply of B12 was sent to her pharmacy

## 2021-07-26 ENCOUNTER — Ambulatory Visit (INDEPENDENT_AMBULATORY_CARE_PROVIDER_SITE_OTHER): Payer: BC Managed Care – PPO | Admitting: Gastroenterology

## 2021-07-26 DIAGNOSIS — E538 Deficiency of other specified B group vitamins: Secondary | ICD-10-CM

## 2021-08-27 ENCOUNTER — Ambulatory Visit (INDEPENDENT_AMBULATORY_CARE_PROVIDER_SITE_OTHER): Payer: BC Managed Care – PPO | Admitting: Gastroenterology

## 2021-08-27 DIAGNOSIS — E538 Deficiency of other specified B group vitamins: Secondary | ICD-10-CM | POA: Diagnosis not present

## 2021-08-30 DIAGNOSIS — J343 Hypertrophy of nasal turbinates: Secondary | ICD-10-CM | POA: Diagnosis not present

## 2021-08-30 DIAGNOSIS — J310 Chronic rhinitis: Secondary | ICD-10-CM | POA: Diagnosis not present

## 2021-08-30 DIAGNOSIS — R0982 Postnasal drip: Secondary | ICD-10-CM | POA: Diagnosis not present

## 2021-09-28 ENCOUNTER — Ambulatory Visit (INDEPENDENT_AMBULATORY_CARE_PROVIDER_SITE_OTHER): Payer: BC Managed Care – PPO | Admitting: Gastroenterology

## 2021-09-28 DIAGNOSIS — E538 Deficiency of other specified B group vitamins: Secondary | ICD-10-CM

## 2021-10-27 ENCOUNTER — Telehealth: Payer: Self-pay | Admitting: Internal Medicine

## 2021-10-27 NOTE — Telephone Encounter (Signed)
Pt call and stated she want a refill on JUNEL FE 1.5/30 1.5-30 MG-MCG tablet sent to [place] [id number]

## 2021-11-01 MED ORDER — NORETHIN ACE-ETH ESTRAD-FE 1.5-30 MG-MCG PO TABS: 1.0000 | ORAL_TABLET | Freq: Every day | ORAL | 1 refills | Status: AC

## 2021-11-01 NOTE — Telephone Encounter (Signed)
A Rx sent 

## 2021-12-23 LAB — HM PAP SMEAR: HM Pap smear: NEGATIVE

## 2022-06-22 ENCOUNTER — Encounter: Payer: Self-pay | Admitting: Internal Medicine

## 2022-06-22 ENCOUNTER — Ambulatory Visit (INDEPENDENT_AMBULATORY_CARE_PROVIDER_SITE_OTHER): Payer: Managed Care, Other (non HMO) | Admitting: Internal Medicine

## 2022-06-22 VITALS — BP 118/88 | HR 72 | Temp 98.3°F | Ht 68.5 in | Wt 165.2 lb

## 2022-06-22 DIAGNOSIS — K900 Celiac disease: Secondary | ICD-10-CM

## 2022-06-22 DIAGNOSIS — R7989 Other specified abnormal findings of blood chemistry: Secondary | ICD-10-CM | POA: Diagnosis not present

## 2022-06-22 DIAGNOSIS — Z0000 Encounter for general adult medical examination without abnormal findings: Secondary | ICD-10-CM

## 2022-06-22 DIAGNOSIS — E538 Deficiency of other specified B group vitamins: Secondary | ICD-10-CM

## 2022-06-22 DIAGNOSIS — Z79899 Other long term (current) drug therapy: Secondary | ICD-10-CM

## 2022-06-22 DIAGNOSIS — Z1159 Encounter for screening for other viral diseases: Secondary | ICD-10-CM

## 2022-06-22 LAB — CBC WITH DIFFERENTIAL/PLATELET
Basophils Absolute: 0.1 10*3/uL (ref 0.0–0.1)
Basophils Relative: 2.2 % (ref 0.0–3.0)
Eosinophils Absolute: 0.2 10*3/uL (ref 0.0–0.7)
Eosinophils Relative: 4.1 % (ref 0.0–5.0)
HCT: 43 % (ref 36.0–46.0)
Hemoglobin: 14.2 g/dL (ref 12.0–15.0)
Lymphocytes Relative: 35.5 % (ref 12.0–46.0)
Lymphs Abs: 1.5 10*3/uL (ref 0.7–4.0)
MCHC: 32.9 g/dL (ref 30.0–36.0)
MCV: 90.5 fl (ref 78.0–100.0)
Monocytes Absolute: 0.3 10*3/uL (ref 0.1–1.0)
Monocytes Relative: 7.3 % (ref 3.0–12.0)
Neutro Abs: 2.2 10*3/uL (ref 1.4–7.7)
Neutrophils Relative %: 50.9 % (ref 43.0–77.0)
Platelets: 259 10*3/uL (ref 150.0–400.0)
RBC: 4.75 Mil/uL (ref 3.87–5.11)
RDW: 12.9 % (ref 11.5–15.5)
WBC: 4.2 10*3/uL (ref 4.0–10.5)

## 2022-06-22 LAB — IBC + FERRITIN
Ferritin: 7.2 ng/mL — ABNORMAL LOW (ref 10.0–291.0)
Iron: 112 ug/dL (ref 42–145)
Saturation Ratios: 24.1 % (ref 20.0–50.0)
TIBC: 464.8 ug/dL — ABNORMAL HIGH (ref 250.0–450.0)
Transferrin: 332 mg/dL (ref 212.0–360.0)

## 2022-06-22 LAB — COMPREHENSIVE METABOLIC PANEL
ALT: 11 U/L (ref 0–35)
AST: 14 U/L (ref 0–37)
Albumin: 4 g/dL (ref 3.5–5.2)
Alkaline Phosphatase: 36 U/L — ABNORMAL LOW (ref 39–117)
BUN: 10 mg/dL (ref 6–23)
CO2: 27 mEq/L (ref 19–32)
Calcium: 9 mg/dL (ref 8.4–10.5)
Chloride: 106 mEq/L (ref 96–112)
Creatinine, Ser: 0.85 mg/dL (ref 0.40–1.20)
GFR: 95.11 mL/min (ref >60.00)
Glucose, Bld: 91 mg/dL (ref 70–99)
Potassium: 4.5 mEq/L (ref 3.5–5.1)
Sodium: 140 mEq/L (ref 135–145)
Total Bilirubin: 0.5 mg/dL (ref 0.2–1.2)
Total Protein: 6.8 g/dL (ref 6.0–8.3)

## 2022-06-22 LAB — LIPID PANEL
Cholesterol: 187 mg/dL (ref 0–200)
HDL: 58.8 mg/dL (ref >39.00)
LDL Cholesterol: 111 mg/dL — ABNORMAL HIGH (ref 0–99)
NonHDL: 127.85
Total CHOL/HDL Ratio: 3
Triglycerides: 84 mg/dL (ref 0.0–149.0)
VLDL: 16.8 mg/dL (ref 0.0–40.0)

## 2022-06-22 LAB — TSH: TSH: 2.61 u[IU]/mL (ref 0.35–5.50)

## 2022-06-22 LAB — VITAMIN B12: Vitamin B-12: 420 pg/mL (ref 211–911)

## 2022-06-22 LAB — VITAMIN D 25 HYDROXY (VIT D DEFICIENCY, FRACTURES): VITD: 26.44 ng/mL — ABNORMAL LOW (ref 30.00–100.00)

## 2022-06-22 NOTE — Progress Notes (Signed)
Chief Complaint  Patient presents with   Annual Exam    HPI: Patient  Teresa [name]  27 y.o. comes in today for Preventive Health Care visit   Doing well ;now lives in [place]  Avoiding gluten as best possible not sure  when needs fu with Gi . Dr Dorris Carnes  B12 daily gummies  but  not doing shots for now   No neuropathy .  Health Maintenance  Topic Date Due   Hepatitis C Screening  Never done   PAP-Cervical Cytology Screening  Never done   PAP SMEAR-Modifier  07/09/2021   COVID-19 Vaccine (4 - 2023-24 season) 07/08/2022 (Originally 09/03/2021)   INFLUENZA VACCINE  08/04/2022   DTaP/Tdap/Td (3 - Td or Tdap) 03/12/2028   HPV VACCINES  Completed   HIV Screening  Completed   Health Maintenance Review LIFESTYLE:  Exercise:   once  week running  no major injury  Tobacco/ETS:  no Alcohol:  minimal  Sugar beverages: once a day   Sleep: 8 except  tax season Drug use: no HH of 1  pet cat.  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 GI /GU: No adominal pain, vomiting, change in bowel habits. No blood in the stool. No significant GU symptoms. SKIN/HEME: ,no acute skin rashes suspicious lesions or bleeding. No lymphadenopathy, nodules, masses.  NEURO/ PSYCH:  No neurologic signs such as weakness numbness. No depression anxiety. IMM/ Allergy: No unusual infections.  Allergy .   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   ARTICULATION DISORDER 08/03/2009   Qualifier: History of  By: Fabian Sharp MD, Neta Mends    Celiac disease    Dysmenorrhea in adolescent 08/23/2013   disc  nsaids preferred and fu if needed    Functional murmur    eval at  early age    Past Surgical History:  Procedure Laterality Date   Rhino plasty     SEPTOPLASTY  APRIL 28TH 2022   WISDOM TOOTH EXTRACTION  08/2014    Family History  Problem Relation Age of Onset   ADD / ADHD Mother    Diabetes Mother        type 2    Anemia Mother    Colon polyps Mother    Irritable bowel syndrome Mother    ADD / ADHD Sister    Hyperlipidemia Other    Depression Other    Hypertension Other    Heart disease Paternal Uncle    Heart attack Maternal Grandfather    Heart attack Paternal Grandfather    Colon cancer Neg Hx    Esophageal cancer Neg Hx    Rectal cancer Neg Hx    Stomach cancer Neg Hx     Social History   Socioeconomic History   Marital status: Single    Spouse name: Not on file   Number of children: Not on file   Years of education: Not on file   Highest education level: Not on file  Occupational History   Not on file  Tobacco Use   Smoking status: Never   Smokeless tobacco: Never  Vaping Use   Vaping Use: Never used  Substance and Sexual Activity   Alcohol use: Yes    Alcohol/week: 0.0 standard drinks of alcohol    Comment: rarely    Drug use: No   Sexual activity: Not Currently  Other Topics Concern   Not on file  Social History Narrative   [employer]  t [employer].    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   School ok   HH of 4   No ets   Softball and swimming   Volley ball       Evaluation for expressive language delay and articulation problem age 61 1/2   Sleep 8 hours   Social Determinants of Health   Financial Resource Strain: Low Risk  (06/22/2022)   Overall Financial Resource Strain (CARDIA)    Difficulty of Paying Living Expenses: Not hard at all  Food Insecurity: Not on file  Transportation Needs: Not on file  Physical Activity: Insufficiently Active (06/22/2022)   Exercise Vital Sign    Days of Exercise per Week: 2 days    Minutes of Exercise per Session: 40 min   Stress: No Stress Concern Present (06/22/2022)   [employer] of Occupational Health - Occupational Stress Questionnaire    Feeling of Stress : Not at all  Social Connections: Socially Isolated (06/22/2022)   Social Connection and Isolation Panel [NHANES]    Frequency of Communication with Friends and Family: Twice a week    Frequency of Social Gatherings with Friends and Family: Never    Attends Religious Services: Never    Database administrator or Organizations: Yes    Attends Engineer, structural: More than 4 times per year    Marital Status: Never married    Outpatient Medications Prior to Visit  Medication Sig Dispense Refill   acetaminophen (TYLENOL) 325 MG tablet Take 650 mg by mouth every 6 (six) hours as needed.     Cyanocobalamin (VITAMIN B-12 PO) Take by mouth.     Ferrous Bisglycinate Chelate POWD TAKE 2 CAPSULES BY MOUTH EVERY DAY 60 g 11   norethindrone-ethinyl estradiol-iron (JUNEL FE 1.5/30) 1.5-30 MG-MCG tablet Take 1 tablet by mouth daily. 84 tablet 1   cyanocobalamin (,VITAMIN B-12,) 1000 MCG/ML injection 1 ml injection every 30 days (Patient not taking: Reported on 06/22/2022) 3 mL 6   No facility-administered medications prior to visit.     EXAM:  BP 118/88 (BP Location: Right Arm, Patient Position: Sitting, Cuff Size: Normal)   Pulse 72   Temp 98.3 F (36.8 C) (Oral)   Ht 5' 8.5" (1.74 m)   Wt 165 lb 3.2 oz (74.9 kg)   LMP 06/21/2022 (Exact Date)   SpO2 99%   BMI 24.75 kg/m   Body mass index is 24.75 kg/m. Wt Readings from Last 3 Encounters:  06/22/22 165 lb 3.2 oz (74.9 kg)  04/21/21 163 lb 0.3 oz (73.9 kg)  02/09/21 158 lb (71.7 kg)    Physical Exam: Vital signs reviewed [id number] is a well-developed well-nourished alert cooperative    who appearsr stated age in no acute distress.  HEENT: normocephalic atraumatic , Eyes: PERRL EOM's full, conjunctiva clear, Nares: paten,t no deformity discharge or tenderness., Ears: no deformity  EAC's clear TMs with normal landmarks. Mouth: clear OP, no lesions, edema.  Moist mucous membranes. Dentition in adequate repair. NECK: supple without masses, thyromegaly or bruits. CHEST/PULM:  Clear to auscultation  and percussion breath sounds equal no wheeze , rales or rhonchi. No chest wall deformities or tenderness. Breast: normal by inspection . No dimpling, discharge, masses, tenderness or discharge . CV: PMI is nondisplaced, S1 S2 no gallops, murmurs, rubs. Peripheral pulses are full without delay.No JVD .  ABDOMEN: Bowel sounds normal nontender  No guard or rebound, no hepato splenomegal no CVA tenderness.  Extremtities:  No clubbing cyanosis or edema, no acute joint swelling or redness no focal atrophy NEURO:  Oriented x3, cranial nerves 3-12 appear to be intact, no obvious focal weakness,gait within normal limits no abnormal reflexes or asymmetrical SKIN: No acute rashes normal turgor, color, no bruising or petechiae. PSYCH: Oriented, good eye contact, no obvious depression anxiety, cognition and judgment appear normal. LN: no cervical axillary adenopathy  Lab Results  Component Value Date   WBC 3.7 (L) 02/09/2021   HGB 13.3 02/09/2021   HCT 40.2 02/09/2021   PLT 224.0 02/09/2021   GLUCOSE 93 02/09/2021   CHOL 203 (H) 04/21/2021   TRIG 100 04/21/2021   HDL 66 04/21/2021   LDLCALC 116 (H) 04/21/2021   ALT 8 02/09/2021   AST 11 02/09/2021   NA 136 02/09/2021   K 4.4 02/09/2021   CL 105 02/09/2021   CREATININE 1.06 02/09/2021   BUN 15 02/09/2021   CO2 26 02/09/2021   TSH 2.42 04/21/2021    BP Readings from Last 3 Encounters:  06/22/22 118/88  04/21/21 120/80  02/09/21 114/76    Lab plan reviewed with patient   ASSESSMENT AND PLAN:  Discussed the following assessment and plan:    ICD-10-CM   1. Visit for preventive health examination  Z00.00 Hep C Antibody    CBC with Differential/Platelet    Comprehensive metabolic panel    Lipid panel    TSH    IBC +  Ferritin    Vitamin B12    2. Medication management  Z79.899 Hep C Antibody    CBC with Differential/Platelet    Comprehensive metabolic panel    Lipid panel    TSH    IBC + Ferritin    Vitamin B12    3. B12 deficiency  E53.8 Hep C Antibody    CBC with Differential/Platelet    Comprehensive metabolic panel    Lipid panel    TSH    IBC + Ferritin    Vitamin B12    Vitamin D, 25-hydroxy    4. Celiac disease  K90.0 Hep C Antibody    CBC with Differential/Platelet    Comprehensive metabolic panel    Lipid panel    TSH    IBC + Ferritin    Vitamin B12    Vitamin D, 25-hydroxy    5. Need for hepatitis C screening test  Z11.59 Hep C Antibody    6. Low vitamin D level  R79.89 Vitamin D, 25-hydroxy    Update labs today Will share with dr N  Have her reach  out about advised fu . Depending.  Sl b12 should be ok for optimum absorption.  Return in about 1 year (around 06/22/2023) for depending on results.  Patient Care Team: Zavannah Deblois, Neta Mends, MD as PCP - Freddi Che, DO as Consulting Physician (Obstetrics and Gynecology) Napoleon Form, MD as Consulting Physician (Gastroenterology) Patient Instructions  Good to see you today. Continue lifestyle intervention healthy eating and exercise .  Lab today will share with Dr Lavon Paganini and have  your reach out to her about appropriate fu. Can  use sublingual or dissolvable B12  daily 1000-2000 mcg per day . Instead of injectable ...  Neta Mends. Teresa [name] M.D.

## 2022-06-22 NOTE — Patient Instructions (Addendum)
Good to see you today. Continue lifestyle intervention healthy eating and exercise .  Lab today will share with Dr Lavon Paganini and have  your reach out to her about appropriate fu. Can use sublingual or dissolvable B12  daily 1000-2000 mcg per day . Instead of injectable .Teresa KitchenMarland [name]

## 2022-06-23 LAB — HEPATITIS C ANTIBODY: Hepatitis C Ab: NONREACTIVE

## 2022-06-27 NOTE — Progress Notes (Signed)
B12 is normal    continue  No anemia but low iron stores  , vit d is slightly low  Suggest Multivit with iron.   Rest of labs result stable or in range fr [facility]  info with team

## 2023-06-27 ENCOUNTER — Encounter: Payer: Managed Care, Other (non HMO) | Admitting: Internal Medicine

## 2023-08-09 ENCOUNTER — Ambulatory Visit (INDEPENDENT_AMBULATORY_CARE_PROVIDER_SITE_OTHER): Admitting: Internal Medicine

## 2023-08-09 VITALS — BP 120/80 | HR 89 | Temp 98.1°F | Ht 68.5 in | Wt 169.0 lb

## 2023-08-09 DIAGNOSIS — E611 Iron deficiency: Secondary | ICD-10-CM

## 2023-08-09 DIAGNOSIS — Z0000 Encounter for general adult medical examination without abnormal findings: Secondary | ICD-10-CM | POA: Diagnosis not present

## 2023-08-09 DIAGNOSIS — Z13220 Encounter for screening for lipoid disorders: Secondary | ICD-10-CM

## 2023-08-09 DIAGNOSIS — E538 Deficiency of other specified B group vitamins: Secondary | ICD-10-CM | POA: Diagnosis not present

## 2023-08-09 DIAGNOSIS — R7989 Other specified abnormal findings of blood chemistry: Secondary | ICD-10-CM | POA: Diagnosis not present

## 2023-08-09 DIAGNOSIS — K900 Celiac disease: Secondary | ICD-10-CM | POA: Diagnosis not present

## 2023-08-09 NOTE — Progress Notes (Signed)
 Chief Complaint  Patient presents with   Annual Exam    HPI: Patient  Teresa [name]  28 y.o. comes in today for Preventive Health Care visit   Celiac iron def  only taking multi vit gummy at this point and attend to gluten free as possible.  Hsant yet established  Gi in [place] area .  A bit more tired  end of day work  . 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  Ocps light periods can control considering iud.   Health Maintenance  Topic Date Due   INFLUENZA VACCINE  08/04/2023   COVID-19 Vaccine (4 - 2024-25 season) 08/25/2023 (Originally 09/04/2022)   Hepatitis B Vaccines (1 of 3 - 19+ 3-dose series) 08/08/2024 (Originally 11/22/2015)   Cervical Cancer Screening (Pap smear)  12/23/2024   DTaP/Tdap/Td (9 - Td or Tdap) 03/12/2028   HPV VACCINES  Completed   Hepatitis C Screening  Completed   HIV Screening  Completed   Meningococcal B Vaccine  Aged Out   Health Maintenance Review LIFESTYLE:  Exercise:  gym and treadmill  some running   Tobacco/ETS:n Alcohol:   minimal  Sugar beverages: Sleep: 8- 10 weekends   and off season 6-7  hours  tax season dependent   5-6  Drug use: no HH of  1 in [place] and cat.  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 GI /GU: No adominal pain, vomiting, change in bowel habits. No blood in the stool. No significant GU symptoms. SKIN/HEME: ,no acute skin rashes suspicious lesions or bleeding. No lymphadenopathy, nodules, masses.  NEURO/ PSYCH:  No neurologic signs such as weakness numbness. No depression anxiety. IMM/ Allergy: No unusual infections.  Allergy .   REST of 12 system review negative except as per HPI   Past Medical History:  Diagnosis Date   Anemia    Articulation delay    age 89 therapy   ARTICULATION DISORDER  08/03/2009   Qualifier: History of  By: Charlett MD, Apolinar POUR    Celiac disease    Dysmenorrhea in adolescent 08/23/2013   disc  nsaids preferred and fu if needed    Functional murmur    eval at early age    Past Surgical History:  Procedure Laterality Date   Rhino plasty     SEPTOPLASTY  APRIL 28TH 2022   WISDOM TOOTH EXTRACTION  08/2014    Family History  Problem Relation Age of Onset   ADD / ADHD Mother    Diabetes Mother        type 2    Anemia Mother    Colon polyps Mother    Irritable bowel syndrome Mother    ADD / ADHD Sister    Hyperlipidemia Other    Depression Other    Hypertension Other    Heart disease Paternal Uncle    Heart attack Maternal Grandfather    Heart attack Paternal Grandfather    Colon cancer Neg Hx    Esophageal cancer Neg Hx    Rectal cancer Neg Hx    Stomach cancer Neg Hx     Social History   Socioeconomic History   Marital status: Single    Spouse name: Not on file   Number of children: Not on file   Years of education: Not  on file   Highest education level: Master's degree (e.g., MA, MS, MEng, MEd, MSW, MBA)  Occupational History   Not on file  Tobacco Use   Smoking status: Never   Smokeless tobacco: Never  Vaping Use   Vaping status: Never Used  Substance and Sexual Activity   Alcohol use: Yes    Alcohol/week: 0.0 standard drinks of alcohol    Comment: rarely    Drug use: No   Sexual activity: Not Currently  Other Topics Concern   Not on file  Social History Narrative   Teresa [name]  t [employer].    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   School ok   HH of 4   No ets   Softball and swimming   Volley ball       Evaluation for expressive language delay and articulation problem age 84 1/2   Sleep 8 hours   Social Drivers of Corporate investment banker Strain: Low Risk  (08/09/2023)   Overall Financial Resource Strain (CARDIA)    Difficulty of Paying Living Expenses: Not hard at  all  Food Insecurity: No Food Insecurity (08/09/2023)   Hunger Vital Sign    Worried About Running Out of Food in the Last Year: Never true    Ran Out of Food in the Last Year: Never true  Transportation Needs: No Transportation Needs (08/09/2023)   PRAPARE - Administrator, Civil Service (Medical): No    Lack of Transportation (Non-Medical): No  Physical Activity: Insufficiently Active (08/09/2023)   Exercise Vital Sign    Days of Exercise per Week: 1 day    Minutes of Exercise per Session: 40 min  Stress: No Stress Concern Present (08/09/2023)   [employer] of Occupational Health - Occupational Stress Questionnaire    Feeling of Stress: Not at all  Social Connections: Socially Isolated (08/09/2023)   Social Connection and Isolation Panel    Frequency of Communication with Friends and Family: Three times a week    Frequency of Social Gatherings with Friends and Family: Twice a week    Attends Religious Services: Never    Database administrator or Organizations: No    Attends Engineer, structural: Not on file    Marital Status: Never married    Outpatient Medications Prior to Visit  Medication Sig Dispense Refill   acetaminophen (TYLENOL) 325 MG tablet Take 650 mg by mouth every 6 (six) hours as needed.     norethindrone-ethinyl estradiol-iron (JUNEL FE 1.5/30) 1.5-30 MG-MCG tablet Take 1 tablet by mouth daily. 84 tablet 1   cyanocobalamin  (,VITAMIN B-12,) 1000 MCG/ML injection 1 ml injection every 30 days (Patient not taking: Reported on 08/09/2023) 3 mL 6   Cyanocobalamin  (VITAMIN B-12 PO) Take by mouth. (Patient not taking: Reported on 08/09/2023)     Ferrous Bisglycinate  Chelate POWD TAKE 2 CAPSULES BY MOUTH EVERY DAY (Patient not taking: Reported on 08/09/2023) 60 g 11   No facility-administered medications prior to visit.     EXAM:  BP 120/80 (BP Location: Left Arm, Patient Position: Sitting, Cuff Size: Normal)   Pulse 89   Temp 98.1 F (36.7 C) (Oral)   Ht  5' 8.5 (1.74 m)   Wt 169 lb (76.7 kg)   LMP 08/01/2023 (Exact Date)   SpO2 96%   BMI 25.32 kg/m   Body mass index is 25.32 kg/m. Wt Readings from Last 3  Encounters:  08/09/23 169 lb (76.7 kg)  06/22/22 165 lb 3.2 oz (74.9 kg)  04/21/21 163 lb 0.3 oz (73.9 kg)    Physical Exam: Vital signs reviewed [id number] is a well-developed well-nourished alert cooperative    who appearsr stated age in no acute distress.  HEENT: normocephalic atraumatic , Eyes: PERRL EOM's full, conjunctiva clear, Nares: paten,t no deformity discharge or tenderness., Ears: no deformity EAC's clear TMs with normal landmarks. Mouth: clear OP, no lesions, edema.  Moist mucous membranes. Dentition in adequate repair. NECK: supple without masses, thyromegaly or bruits. CHEST/PULM:  Clear to auscultation and percussion breath sounds equal no wheeze , rales or rhonchi. No chest wall deformities or tenderness. Breast: normal by inspection . No dimpling, discharge, masses, tenderness or discharge . CV: PMI is nondisplaced, S1 S2 no gallops, murmurs, rubs. Ocass click only when supine and not upright oir standing and no murmur  Peripheral pulses are full without delay.No JVD .  ABDOMEN: Bowel sounds normal nontender  No guard or rebound, no hepato splenomegal no CVA tenderness.  Extremtities:  No clubbing cyanosis or edema, no acute joint swelling or redness no focal atrophy NEURO:  Oriented x3, cranial nerves 3-12 appear to be intact, no obvious focal weakness,gait within normal limits no abnormal reflexes or asymmetrical SKIN: No acute rashes normal turgor, color, no bruising or petechiae. PSYCH: Oriented, good eye contact, no obvious depression anxiety, cognition and judgment appear normal. LN: no cervical axillary adenopathy  Lab Results  Component Value Date   WBC 4.2 06/22/2022   HGB 14.2 06/22/2022   HCT 43.0 06/22/2022   PLT 259.0 06/22/2022   GLUCOSE 91 06/22/2022   CHOL 187 06/22/2022   TRIG 84.0 06/22/2022    HDL 58.80 06/22/2022   LDLCALC 111 (H) 06/22/2022   ALT 11 06/22/2022   AST 14 06/22/2022   NA 140 06/22/2022   K 4.5 06/22/2022   CL 106 06/22/2022   CREATININE 0.85 06/22/2022   BUN 10 06/22/2022   CO2 27 06/22/2022   TSH 2.61 06/22/2022    BP Readings from Last 3 Encounters:  08/09/23 120/80  06/22/22 118/88  04/21/21 120/80   Lab Results  Component Value Date   IRON 112 06/22/2022   TIBC 464.8 (H) 06/22/2022   FERRITIN 7.2 (L) 06/22/2022   Lab Results  Component Value Date   VITAMINB12 420 06/22/2022    Last vitamin D  Lab Results  Component Value Date   VD25OH 26.44 (L) 06/22/2022    Lab plan  reviewed with patient  ate luch chicken and rice and water   ASSESSMENT AND PLAN:  Discussed the following assessment and plan:    ICD-10-CM   1. Visit for preventive health examination  Z00.00 CBC with Differential/Platelet    Comprehensive metabolic panel with GFR    Lipid panel    TSH    IBC + Ferritin    Vitamin B12    Vitamin D , 25-hydroxy    2. Celiac disease  K90.0 CBC with Differential/Platelet    Comprehensive metabolic panel with GFR    Lipid panel    TSH    IBC + Ferritin    Vitamin B12    Vitamin D , 25-hydroxy    3. B12 deficiency  E53.8 CBC with Differential/Platelet    Comprehensive metabolic panel with GFR    Lipid panel    TSH    IBC + Ferritin    Vitamin B12    Vitamin D , 25-hydroxy    4. Low vitamin  D level  R79.89 CBC with Differential/Platelet    Comprehensive metabolic panel with GFR    Lipid panel    TSH    IBC + Ferritin    Vitamin B12    Vitamin D , 25-hydroxy    5. Iron deficiency  E61.1 CBC with Differential/Platelet    Comprehensive metabolic panel with GFR    Lipid panel    TSH    IBC + Ferritin    Vitamin B12    Vitamin D , 25-hydroxy    Stable  checking iron def and vit defic  is on otc multivit gummi and gluten free diet . Track sleep when dec as cause of sleeping more on weekends .  May need supplements   depnding on lab  Return in about 1 year (around 08/08/2024) for depending on results.  Patient Care Team: Marjorie Deprey, Apolinar POUR, MD as PCP - Diedre Dannielle Bouchard, DO as Consulting Physician (Obstetrics and Gynecology) Nandigam, Kavitha V, MD as Consulting Physician (Gastroenterology) Patient Instructions  Good to see you today .  updating labs including iron vit d b12 studies   Advise   gluten free diet and establish with Gi team   locally . Continue lifestyle optimization healthy eating and exercise .   Ambert Virrueta K. Katriana Dortch M.D.

## 2023-08-09 NOTE — Patient Instructions (Signed)
 Good to see you today .  updating labs including iron vit d b12 studies   Advise   gluten free diet and establish with Gi team   locally . Continue lifestyle optimization healthy eating and exercise .

## 2023-08-10 ENCOUNTER — Ambulatory Visit: Payer: Self-pay | Admitting: Internal Medicine

## 2023-08-10 LAB — IBC + FERRITIN
Ferritin: 30 ng/mL (ref 10.0–291.0)
Iron: 188 ug/dL — ABNORMAL HIGH (ref 42–145)
Saturation Ratios: 50.5 % — ABNORMAL HIGH (ref 20.0–50.0)
TIBC: 372.4 ug/dL (ref 250.0–450.0)
Transferrin: 266 mg/dL (ref 212.0–360.0)

## 2023-08-10 LAB — COMPREHENSIVE METABOLIC PANEL WITH GFR
ALT: 13 U/L (ref 0–35)
AST: 15 U/L (ref 0–37)
Albumin: 4 g/dL (ref 3.5–5.2)
Alkaline Phosphatase: 35 U/L — ABNORMAL LOW (ref 39–117)
BUN: 14 mg/dL (ref 6–23)
CO2: 24 meq/L (ref 19–32)
Calcium: 9 mg/dL (ref 8.4–10.5)
Chloride: 104 meq/L (ref 96–112)
Creatinine, Ser: 0.9 mg/dL (ref 0.40–1.20)
GFR: 88.1 mL/min (ref >60.00)
Glucose, Bld: 90 mg/dL (ref 70–99)
Potassium: 4.2 meq/L (ref 3.5–5.1)
Sodium: 140 meq/L (ref 135–145)
Total Bilirubin: 0.5 mg/dL (ref 0.2–1.2)
Total Protein: 6.6 g/dL (ref 6.0–8.3)

## 2023-08-10 LAB — LIPID PANEL
Cholesterol: 188 mg/dL (ref 0–200)
HDL: 61.6 mg/dL (ref >39.00)
LDL Cholesterol: 95 mg/dL (ref 0–99)
NonHDL: 126.03
Total CHOL/HDL Ratio: 3
Triglycerides: 155 mg/dL — ABNORMAL HIGH (ref 0.0–149.0)
VLDL: 31 mg/dL (ref 0.0–40.0)

## 2023-08-10 LAB — CBC WITH DIFFERENTIAL/PLATELET
Basophils Absolute: 0.1 K/uL (ref 0.0–0.1)
Basophils Relative: 1.3 % (ref 0.0–3.0)
Eosinophils Absolute: 0.3 K/uL (ref 0.0–0.7)
Eosinophils Relative: 5.8 % — ABNORMAL HIGH (ref 0.0–5.0)
HCT: 42.1 % (ref 36.0–46.0)
Hemoglobin: 14.1 g/dL (ref 12.0–15.0)
Lymphocytes Relative: 36.8 % (ref 12.0–46.0)
Lymphs Abs: 1.7 K/uL (ref 0.7–4.0)
MCHC: 33.4 g/dL (ref 30.0–36.0)
MCV: 89.6 fl (ref 78.0–100.0)
Monocytes Absolute: 0.4 K/uL (ref 0.1–1.0)
Monocytes Relative: 7.6 % (ref 3.0–12.0)
Neutro Abs: 2.3 K/uL (ref 1.4–7.7)
Neutrophils Relative %: 48.5 % (ref 43.0–77.0)
Platelets: 258 K/uL (ref 150.0–400.0)
RBC: 4.69 Mil/uL (ref 3.87–5.11)
RDW: 12.8 % (ref 11.5–15.5)
WBC: 4.7 K/uL (ref 4.0–10.5)

## 2023-08-10 LAB — VITAMIN B12: Vitamin B-12: 294 pg/mL (ref 211–911)

## 2023-08-10 LAB — TSH: TSH: 2.8 u[IU]/mL (ref 0.35–5.50)

## 2023-08-10 LAB — VITAMIN D 25 HYDROXY (VIT D DEFICIENCY, FRACTURES): VITD: 28.08 ng/mL — ABNORMAL LOW (ref 30.00–100.00)

## 2023-08-10 NOTE — Progress Notes (Signed)
 No anemia and in  fact iron levels are on high side. Vit d slightly low and b12 low normal  Rest of lab : tg slightly  up but ok as not fasting  specimen.   I advise stop  vits with iron  and take daily multivitamin  that has D and b12 . But not iron. Continue lifestyle attention healthy eating and exercise .

## 2023-12-03 ENCOUNTER — Encounter (HOSPITAL_COMMUNITY): Payer: Self-pay | Admitting: Emergency Medicine

## 2023-12-03 ENCOUNTER — Other Ambulatory Visit: Payer: Self-pay

## 2023-12-03 ENCOUNTER — Ambulatory Visit (HOSPITAL_COMMUNITY)
Admission: EM | Admit: 2023-12-03 | Discharge: 2023-12-03 | Disposition: A | Discharge status: Home / Self Care | Attending: Physician Assistant | Admitting: Physician Assistant

## 2023-12-03 DIAGNOSIS — J0100 Acute maxillary sinusitis, unspecified: Secondary | ICD-10-CM | POA: Diagnosis not present

## 2023-12-03 MED ORDER — AMOXICILLIN-POT CLAVULANATE 875-125 MG PO TABS: 1.0000 | ORAL_TABLET | Freq: Two times a day (BID) | ORAL | 0 refills | Status: AC

## 2023-12-03 MED ORDER — AMOXICILLIN-POT CLAVULANATE 875-125 MG PO TABS: 1.0000 | ORAL_TABLET | Freq: Two times a day (BID) | ORAL | 0 refills | Status: DC

## 2023-12-03 NOTE — ED Triage Notes (Signed)
 Onset 11/15.  Went to [place].  Initially had a runny nose and congestion.  Returned on thanksgiving day.  Symptoms worsened and color of phlegm has been yellow and green.  Patient did take a cold and flu medicine obtained over seas.

## 2023-12-03 NOTE — Discharge Instructions (Addendum)
 We are treating you for sinus infection.  Please take Augmentin twice daily for 7 days.  Use over-the-counter medications including Mucinex, Flonase, nasal saline/sinus rinses for additional symptom relief.  Antibiotics can decrease the effectiveness of your birth control so use backup birth control while on this medication and until your next menstrual cycle.  If you are not feeling better in a week please return.  If anything worsens and you have high fever, severe headache, cough, shortness of breath, chest pain you need to be seen immediately.

## 2023-12-03 NOTE — ED Provider Notes (Signed)
 [facility]    CSN: [id number] Arrival date & time: 12/03/23  1608      History   Chief Complaint Chief Complaint  Patient presents with   URI    HPI Teresa [name] is a 28 y.o. female.   Patient presents today with a 2-week history of URI symptoms.  Reports that on 11/18/2023 she developed some congestion and rhinorrhea while she was traveling to [place].  During her trip she had intermittent URI symptoms that she managed with over-the-counter medications.  When she returned home a few days ago she continued to have worsening symptoms including significant sinus pressure and congestion prompting evaluation.  She initially had a cough but this is since improved.  Denies any significant cough currently, associated chest pain, shortness of breath, nausea, vomiting, diarrhea (she has had some mildly loose stools but states she believes this is more related to gluten exposure during the holiday than the illness).  She denies any recent antibiotics or steroids.  She does have a history of recurrent sinus infections with similar presentation.  She was followed by an ENT and had a rhinoplasty several years ago but has not had any recent sinus surgery.    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      Home Medications    Prior to Admission medications   Medication Sig Start  Date End Date Taking? Authorizing Provider  acetaminophen (TYLENOL) 325 MG tablet Take 650 mg by mouth every 6 (six) hours as needed.    [provider]  amoxicillin -clavulanate (AUGMENTIN) 875-125 MG tablet Take 1 tablet by mouth every 12 (twelve) hours. 12/03/23   Rising, Asberry, PA-C  norethindrone-ethinyl estradiol-iron (JUNEL FE 1.5/30) 1.5-30 MG-MCG tablet Take 1 tablet by mouth daily. 11/01/21   Panosh, Apolinar POUR, MD    Family History Family History  Problem Relation Age of Onset   ADD / ADHD Mother    Diabetes Mother        type 2    Anemia Mother    Colon polyps Mother    Irritable bowel syndrome Mother    ADD / ADHD Sister    Hyperlipidemia Other    Depression Other    Hypertension Other    Heart disease Paternal Uncle    Heart attack Maternal Grandfather    Heart attack Paternal Grandfather    Colon cancer Neg Hx    Esophageal cancer Neg Hx    Rectal cancer Neg Hx    Stomach cancer Neg Hx     Social History Social History   Tobacco Use   Smoking status: Never   Smokeless tobacco: Never  Vaping Use   Vaping status: Never Used  Substance Use Topics   Alcohol use:  Yes    Alcohol/week: 0.0 standard drinks of alcohol    Comment: rarely    Drug use: No     Allergies   Other   Review of Systems Review of Systems  Constitutional:  Positive for activity change. Negative for appetite change, fatigue and fever.  HENT:  Positive for congestion, postnasal drip and sinus pressure. Negative for sneezing and sore throat.   Respiratory:  Positive for cough (Rare cough). Negative for shortness of breath.   Cardiovascular:  Negative for chest pain.  Gastrointestinal:  Negative for abdominal pain, diarrhea (Looser stools but no diarrhea), nausea and vomiting.  Neurological:  Positive for headaches. Negative for dizziness and light-headedness.     Physical Exam Triage Vital Signs ED Triage Vitals  Encounter Vitals Group     BP 12/03/23 1659 119/78      Girls Systolic BP Percentile --      Girls Diastolic BP Percentile --      Boys Systolic BP Percentile --      Boys Diastolic BP Percentile --      Pulse Rate 12/03/23 1659 87     Resp 12/03/23 1659 18     Temp 12/03/23 1659 98 F (36.7 C)     Temp Source 12/03/23 1659 Oral     SpO2 12/03/23 1659 98 %     Weight --      Height --      Head Circumference --      Peak Flow --      Pain Score 12/03/23 1655 4     Pain Loc --      Pain Education --      Exclude from Growth Chart --    No data found.  Updated Vital Signs BP 119/78 (BP Location: Right Arm)   Pulse 87   Temp 98 F (36.7 C) (Oral)   Resp 18   LMP 11/05/2023 (Approximate)   SpO2 98%   Visual Acuity Right Eye Distance:   Left Eye Distance:   Bilateral Distance:    Right Eye Near:   Left Eye Near:    Bilateral Near:     Physical Exam Vitals reviewed.  Constitutional:      General: She is awake. She is not in acute distress.    Appearance: Normal appearance. She is well-developed. She is not ill-appearing.     Comments: Very pleasant female appears stated age in no acute distress sitting comfortably in exam room  HENT:     Head: Normocephalic and atraumatic.     Right Ear: Ear canal and external ear normal. A middle ear effusion is present. Tympanic membrane is not erythematous or bulging.     Left Ear: Ear canal and external ear normal. A middle ear effusion is present. Tympanic membrane is not erythematous or bulging.     Nose:     Right Sinus: Maxillary sinus tenderness present. No frontal sinus tenderness.     Left Sinus: No maxillary sinus tenderness or frontal sinus tenderness.     Mouth/Throat:     Pharynx: Uvula midline. Postnasal drip present. No oropharyngeal exudate or posterior oropharyngeal erythema.  Cardiovascular:     Rate and Rhythm: Normal rate and regular rhythm.     Heart sounds: Normal heart sounds, S1 normal and S2 normal. No murmur heard. Pulmonary:     Effort: Pulmonary effort is  normal.     Breath sounds: Normal breath sounds. No wheezing, rhonchi or rales.     Comments: Clear  to auscultation bilaterally Psychiatric:        Behavior: Behavior is cooperative.      UC Treatments / Results  Labs (all labs ordered are listed, but only abnormal results are displayed) Labs Reviewed - No data to display  EKG   Radiology No results found.  Procedures Procedures (including critical care time)  Medications Ordered in UC Medications - No data to display  Initial Impression / Assessment and Plan / UC Course  I have reviewed the triage vital signs and the nursing notes.  Pertinent labs & imaging results that were available during my care of the patient were reviewed by me and considered in my medical decision making (see chart for details).     Patient is well-appearing, afebrile, nontoxic, nontachycardic.  No indication for viral testing as she has been symptomatic for several weeks and this would not change management.  Given her prolonged and recent worsening symptoms concern for secondary bacterial infection so we will start Augmentin twice daily for 7 days.  We discussed that this medication can decrease the effectiveness of her birth control and so she should use backup birth control while on this medication and until her next menstrual cycle.  Also recommended over-the-counter medicines including Mucinex, Flonase, nasal saline/sinus rinses.  Chest x-ray was deferred as she had no adventitious lung sounds and her oxygen saturation was 98%.  We discussed that if she is not feeling better within a week or if anything worsens and she has high fever, worsening cough, shortness of breath, severe headache she needs to be seen immediately.  Return precautions given.  Excuse note provided.  Final Clinical Impressions(s) / UC Diagnoses   Final diagnoses:  Acute non-recurrent maxillary sinusitis     Discharge Instructions      We are treating you for sinus  infection.  Please take Augmentin twice daily for 7 days.  Use over-the-counter medications including Mucinex, Flonase, nasal saline/sinus rinses for additional symptom relief.  Antibiotics can decrease the effectiveness of your birth control so use backup birth control while on this medication and until your next menstrual cycle.  If you are not feeling better in a week please return.  If anything worsens and you have high fever, severe headache, cough, shortness of breath, chest pain you need to be seen immediately.     ED Prescriptions     Medication Sig Dispense Auth. Provider   amoxicillin -clavulanate (AUGMENTIN) 875-125 MG tablet  (Status: Discontinued) Take 1 tablet by mouth every 12 (twelve) hours. 14 tablet Wright Gravely K, PA-C   amoxicillin -clavulanate (AUGMENTIN) 875-125 MG tablet Take 1 tablet by mouth every 12 (twelve) hours. 14 tablet Rising, Asberry, PA-C      PDMP not reviewed this encounter.   Sherrell Rocky POUR, PA-C 12/03/23 8251
